# Patient Record
Sex: Female | Born: 1985
Health system: Southern US, Community
[De-identification: ages and names within clinical notes are randomized; demographics above are authoritative.]

## PROBLEM LIST (undated history)

## (undated) DIAGNOSIS — F429 Obsessive-compulsive disorder, unspecified: Secondary | ICD-10-CM

## (undated) DIAGNOSIS — F419 Anxiety disorder, unspecified: Secondary | ICD-10-CM

## (undated) HISTORY — DX: Obsessive-compulsive disorder, unspecified: F42.9

## (undated) HISTORY — DX: Anxiety disorder, unspecified: F41.9

---
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
## 2019-06-19 NOTE — L&D Delivery Note (Signed)
Delivery Note At 11:13 PM a viable female was delivered via Vaginal, Spontaneous (Presentation: DOA     ).  APGAR: per nurse, slow to cry but tone good , ; weight  .   Placenta status: spont, intact ,  .  Cord:3VC, tight nuchal, unable to reduce and clamped and cut on perineum.   with the following complications:  .  Cord pH: sent/ pending  Anesthesia:  epidural Episiotomy:  none Lacerations:  1st degree Suture Repair: 2.0 vicryl rapide 2 separate figure of 8s Est. Blood Loss (mL):  450, aggressive bleeding after placenta for a few minutes, treated with 1000 mcg rectal cytotec, pitocin and fundal massage. Bleeding stopped abruptly, IV TXA ordered but never started.  Mom to postpartum.  Baby to Couplet care / Skin to Skin.  Lendon Colonel 02/04/2020, 11:28 PM

## 2019-06-25 LAB — OB RESULTS CONSOLE ABO/RH: RH Type: POSITIVE

## 2019-06-25 LAB — OB RESULTS CONSOLE RPR: RPR: NONREACTIVE

## 2019-06-25 LAB — OB RESULTS CONSOLE RUBELLA ANTIBODY, IGM: Rubella: IMMUNE

## 2019-06-25 LAB — OB RESULTS CONSOLE GC/CHLAMYDIA
Chlamydia: NEGATIVE
Gonorrhea: NEGATIVE

## 2019-06-25 LAB — OB RESULTS CONSOLE ANTIBODY SCREEN: Antibody Screen: NEGATIVE

## 2019-06-25 LAB — OB RESULTS CONSOLE HIV ANTIBODY (ROUTINE TESTING): HIV: NONREACTIVE

## 2019-06-25 LAB — OB RESULTS CONSOLE HEPATITIS B SURFACE ANTIGEN: Hepatitis B Surface Ag: NEGATIVE

## 2019-09-11 ENCOUNTER — Other Ambulatory Visit: Payer: Self-pay

## 2019-09-11 ENCOUNTER — Ambulatory Visit: Payer: BC Managed Care – PPO | Admitting: Cardiovascular Disease

## 2019-09-11 ENCOUNTER — Encounter: Payer: Self-pay | Admitting: Cardiovascular Disease

## 2019-09-11 ENCOUNTER — Telehealth: Payer: Self-pay | Admitting: *Deleted

## 2019-09-11 VITALS — BP 146/92 | HR 67 | Ht 65.25 in | Wt 118.4 lb

## 2019-09-11 DIAGNOSIS — I10 Essential (primary) hypertension: Secondary | ICD-10-CM | POA: Diagnosis not present

## 2019-09-11 DIAGNOSIS — Z3A16 16 weeks gestation of pregnancy: Secondary | ICD-10-CM

## 2019-09-11 DIAGNOSIS — Z3490 Encounter for supervision of normal pregnancy, unspecified, unspecified trimester: Secondary | ICD-10-CM

## 2019-09-11 HISTORY — DX: Essential (primary) hypertension: I10

## 2019-09-11 HISTORY — DX: Encounter for supervision of normal pregnancy, unspecified, unspecified trimester: Z34.90

## 2019-09-11 NOTE — Addendum Note (Signed)
Addended by: Burnell Blanks on: 09/11/2019 06:29 PM   Modules accepted: Orders

## 2019-09-11 NOTE — Telephone Encounter (Signed)
Spoke with patient regarding sleep study, she does not want to have at this time Dr [facility] Salvia aware

## 2019-09-11 NOTE — Patient Instructions (Addendum)
Medication Instructions:  Your physician recommends that you continue on your current medications as directed. Please refer to the Current Medication list given to you today.   Labwork: NONE   Testing/Procedures: Your physician has requested that you have a renal artery duplex. During this test, an ultrasound is used to evaluate blood flow to the kidneys. Allow one hour for this exam. Do not eat after midnight the day before and avoid carbonated beverages. Take your medications as you usually do.  CALL IF YOU CHANGE YOUR MIND ON THE SLEEP STUDY DR Ophir RECOMMENDED    Follow-Up: Your physician recommends that you schedule a follow-up appointment in: 1 MONTH WITH PHARM D    Your physician recommends that you schedule a follow-up appointment in: 4 MONTHS WITH DR [facility] THE OFFICE WILL CALL YOU WITH APPOINTMENT   MONITOR AND LOG YOUR BLOOD PRESSURE TWICE A DAY. BRING READINGS TO YOUR FOLLOW UP   DASH Eating Plan DASH stands for "Dietary Approaches to Stop Hypertension." The DASH eating plan is a healthy eating plan that has been shown to reduce high blood pressure (hypertension). It may also reduce your risk for type 2 diabetes, heart disease, and stroke. The DASH eating plan may also help with weight loss. What are tips for following this plan?  General guidelines  Avoid eating more than 2,300 mg (milligrams) of salt (sodium) a day. If you have hypertension, you may need to reduce your sodium intake to 1,500 mg a day.  Limit alcohol intake to no more than 1 drink a day for nonpregnant women and 2 drinks a day for men. One drink equals 12 oz of beer, 5 oz of wine, or 1 oz of hard liquor.  Work with your health care provider to maintain a healthy body weight or to lose weight. Ask what an ideal weight is for you.  Get at least 30 minutes of exercise that causes your heart to beat faster (aerobic exercise) most days of the week. Activities may include walking, swimming, or biking.   Work with your health care provider or diet and nutrition specialist (dietitian) to adjust your eating plan to your individual calorie needs. Reading food labels   Check food labels for the amount of sodium per serving. Choose foods with less than 5 percent of the Daily Value of sodium. Generally, foods with less than 300 mg of sodium per serving fit into this eating plan.  To find whole grains, look for the word "whole" as the first word in the ingredient list. Shopping  Buy products labeled as "low-sodium" or "no salt added."  Buy fresh foods. Avoid canned foods and premade or frozen meals. Cooking  Avoid adding salt when cooking. Use salt-free seasonings or herbs instead of table salt or sea salt. Check with your health care provider or pharmacist before using salt substitutes.  Do not fry foods. Cook foods using healthy methods such as baking, boiling, grilling, and broiling instead.  Cook with heart-healthy oils, such as olive, canola, soybean, or sunflower oil. Meal planning  Eat a balanced diet that includes: ? 5 or more servings of fruits and vegetables each day. At each meal, try to fill half of your plate with fruits and vegetables. ? Up to 6-8 servings of whole grains each day. ? Less than 6 oz of lean meat, poultry, or fish each day. A 3-oz serving of meat is about the same size as a deck of cards. One egg equals 1 oz. ? 2 servings of low-fat  dairy each day. ? A serving of nuts, seeds, or beans 5 times each week. ? Heart-healthy fats. Healthy fats called Omega-3 fatty acids are found in foods such as flaxseeds and coldwater fish, like sardines, salmon, and mackerel.  Limit how much you eat of the following: ? Canned or prepackaged foods. ? Food that is high in trans fat, such as fried foods. ? Food that is high in saturated fat, such as fatty meat. ? Sweets, desserts, sugary drinks, and other foods with added sugar. ? Full-fat dairy products.  Do not salt foods before  eating.  Try to eat at least 2 vegetarian meals each week.  Eat more home-cooked food and less restaurant, buffet, and fast food.  When eating at a restaurant, ask that your food be prepared with less salt or no salt, if possible. What foods are recommended? The items listed may not be a complete list. Talk with your dietitian about what dietary choices are best for you. Grains Whole-grain or whole-wheat bread. Whole-grain or whole-wheat pasta. Brown rice. Modena Morrow. Bulgur. Whole-grain and low-sodium cereals. Pita bread. Low-fat, low-sodium crackers. Whole-wheat flour tortillas. Vegetables Fresh or frozen vegetables (raw, steamed, roasted, or grilled). Low-sodium or reduced-sodium tomato and vegetable juice. Low-sodium or reduced-sodium tomato sauce and tomato paste. Low-sodium or reduced-sodium canned vegetables. Fruits All fresh, dried, or frozen fruit. Canned fruit in natural juice (without added sugar). Meat and other protein foods Skinless chicken or [place]. Ground chicken or [place]. Pork with fat trimmed off. Fish and seafood. Egg whites. Dried beans, peas, or lentils. Unsalted nuts, nut butters, and seeds. Unsalted canned beans. Lean cuts of beef with fat trimmed off. Low-sodium, lean deli meat. Dairy Low-fat (1%) or fat-free (skim) milk. Fat-free, low-fat, or reduced-fat cheeses. Nonfat, low-sodium ricotta or cottage cheese. Low-fat or nonfat yogurt. Low-fat, low-sodium cheese. Fats and oils Soft margarine without trans fats. Vegetable oil. Low-fat, reduced-fat, or light mayonnaise and salad dressings (reduced-sodium). Canola, safflower, olive, soybean, and sunflower oils. Avocado. Seasoning and other foods Herbs. Spices. Seasoning mixes without salt. Unsalted popcorn and pretzels. Fat-free sweets. What foods are not recommended? The items listed may not be a complete list. Talk with your dietitian about what dietary choices are best for you. Grains Baked goods made with fat,  such as croissants, muffins, or some breads. Dry pasta or rice meal packs. Vegetables Creamed or fried vegetables. Vegetables in a cheese sauce. Regular canned vegetables (not low-sodium or reduced-sodium). Regular canned tomato sauce and paste (not low-sodium or reduced-sodium). Regular tomato and vegetable juice (not low-sodium or reduced-sodium). Angie Fava. Olives. Fruits Canned fruit in a light or heavy syrup. Fried fruit. Fruit in cream or butter sauce. Meat and other protein foods Fatty cuts of meat. Ribs. Fried meat. Berniece Salines. Sausage. Bologna and other processed lunch meats. Salami. Fatback. Hotdogs. Bratwurst. Salted nuts and seeds. Canned beans with added salt. Canned or smoked fish. Whole eggs or egg yolks. Chicken or [place] with skin. Dairy Whole or 2% milk, cream, and half-and-half. Whole or full-fat cream cheese. Whole-fat or sweetened yogurt. Full-fat cheese. Nondairy creamers. Whipped toppings. Processed cheese and cheese spreads. Fats and oils Butter. Stick margarine. Lard. Shortening. Ghee. Bacon fat. Tropical oils, such as coconut, palm kernel, or palm oil. Seasoning and other foods Salted popcorn and pretzels. Onion salt, garlic salt, seasoned salt, table salt, and sea salt. Worcestershire sauce. Tartar sauce. Barbecue sauce. Teriyaki sauce. Soy sauce, including reduced-sodium. Steak sauce. Canned and packaged gravies. Fish sauce. Oyster sauce. Cocktail sauce. Horseradish that you find  on the shelf. Ketchup. Mustard. Meat flavorings and tenderizers. Bouillon cubes. Hot sauce and Tabasco sauce. Premade or packaged marinades. Premade or packaged taco seasonings. Relishes. Regular salad dressings. Where to find more information:  National Heart, Lung, and Blood Institute: PopSteam.is  American Heart Association: www.heart.org Summary  The DASH eating plan is a healthy eating plan that has been shown to reduce high blood pressure (hypertension). It may also reduce your risk for  type 2 diabetes, heart disease, and stroke.  With the DASH eating plan, you should limit salt (sodium) intake to 2,300 mg a day. If you have hypertension, you may need to reduce your sodium intake to 1,500 mg a day.  When on the DASH eating plan, aim to eat more fresh fruits and vegetables, whole grains, lean proteins, low-fat dairy, and heart-healthy fats.  Work with your health care provider or diet and nutrition specialist (dietitian) to adjust your eating plan to your individual calorie needs. This information is not intended to replace advice given to you by your health care provider. Make sure you discuss any questions you have with your health care provider. Document Released: 05/24/2011 Document Revised: 05/17/2017 Document Reviewed: 05/28/2016 Elsevier Patient Education  2020 [employer].

## 2019-09-11 NOTE — Progress Notes (Signed)
Hypertension Clinic Initial Assessment:    Date:  09/11/2019   ID:  Tammie Singh, DOB [birth year], MRN [id number]  PCP:  Patient, No Pcp Per  Cardiologist:  No primary care provider on file.  Nephrologist:  Referring MD: No ref. provider found   CC: Hypertension  History of Present Illness:    Tammie Singh is a 34 y.o. female at 31 weeks of pregnancy with a hx of hypertension here to establish care in the hypertension clinic.  She reports that her BP has been high since she was in college.  No etiology was identified.  She is currently [redacted] weeks pregnant and her BP has been as high as the 190s over 110s when she goes to her OB.  She notes that it is not that high when she checks it at home.  It typically runs in the 140s systolic.  She was started on labetalol.  Since that time her blood pressure has been around 110s to 120s over 60s to 70s.  She has felt well.  She notes that she has significant whitecoat hypertension.  When she goes to the office it is high and then when the staff alerts her about the value it seems to get higher.  She exercises regularly and has no exertional chest pain or shortness of breath.  She denies any lower extremity edema, orthopnea, or PND.  She saw Dr. Ernestina Penna and had labs that were negative for urine protein.  Blood counts were within normal limits.  Kidney function, liver function, and electrolytes were all.  She was referred to cardiology for further evaluation.  She notes that her diet is very good.  She eats lots of fruits and vegetables and does not have much salt in her diet.  She doesn't drink EtOH now but none.  She does not smoke.  Her husband notes that she snores and sometimes has apneic episodes.  Previous antihypertensives: Lisinopril 5mg  in college   Past Medical History:  Diagnosis Date  . Anxiety   . Essential hypertension 09/11/2019  . Nephritis 2005  . OCD (obsessive compulsive disorder)   . Pregnancy 09/11/2019    Past  Surgical History:  Procedure Laterality Date  . WISDOM TOOTH EXTRACTION  2005    Current Medications: Current Meds  Medication Sig  . aspirin EC 81 MG tablet Take 81 mg by mouth daily.  . clonazePAM (KLONOPIN) 0.5 MG tablet Take 0.5 mg by mouth 2 (two) times daily as needed.  . Folic Acid 0.8 MG CAPS Take by mouth.  . labetalol (NORMODYNE) 200 MG tablet Take 200 mg by mouth every 8 (eight) hours.  . TRINTELLIX 10 MG TABS tablet Take 10 mg by mouth at bedtime.  . vortioxetine HBr (TRINTELLIX) 20 MG TABS tablet Take 20 mg by mouth daily.     Allergies:   Patient has no known allergies.   Social History   Socioeconomic History  . Marital status: Married    Spouse name: Not on file  . Number of children: Not on file  . Years of education: Not on file  . Highest education level: Not on file  Occupational History  . Not on file  Tobacco Use  . Smoking status: Never Smoker  . Smokeless tobacco: Never Used  Substance and Sexual Activity  . Alcohol use: Not Currently  . Drug use: Never  . Sexual activity: Not on file  Other Topics Concern  . Not on file  Social History Narrative  . Not on file  Social Determinants of Health   Financial Resource Strain:   . Difficulty of Paying Living Expenses:   Food Insecurity:   . Worried About Charity fundraiser in the Last Year:   . Arboriculturist in the Last Year:   Transportation Needs:   . Film/video editor (Medical):   Marland Kitchen Lack of Transportation (Non-Medical):   Physical Activity:   . Days of Exercise per Week:   . Minutes of Exercise per Session:   Stress:   . Feeling of Stress :   Social Connections:   . Frequency of Communication with Friends and Family:   . Frequency of Social Gatherings with Friends and Family:   . Attends Religious Services:   . Active Member of Clubs or Organizations:   . Attends Archivist Meetings:   Marland Kitchen Marital Status:      Family History: The patient's family history includes  Asthma in her mother; Atrial fibrillation in her father; CAD in her father; Diabetes in her father; Heart disease in her father; High blood pressure in her maternal grandfather and maternal grandmother; Irritable bowel syndrome in her mother; Lung cancer in her maternal grandfather; Migraines in her sister.  ROS:   Please see the history of present illness.     All other systems reviewed and are negative.  EKGs/Labs/Other Studies Reviewed:    EKG:  EKG is not ordered today.     Recent Labs: No results found for requested labs within last 8760 hours.   Recent Lipid Panel No results found for: CHOL, TRIG, HDL, CHOLHDL, VLDL, LDLCALC, LDLDIRECT  Physical Exam:    VS:  BP (!) 146/92   Pulse 67   Ht 5' 5.25" (1.657 m)   Wt 118 lb 6.4 oz (53.7 kg)   SpO2 95%   BMI 19.55 kg/m     Wt Readings from Last 3 Encounters:  09/11/19 118 lb 6.4 oz (53.7 kg)    VS:  BP (!) 146/92   Pulse 67   Ht 5' 5.25" (1.657 m)   Wt 118 lb 6.4 oz (53.7 kg)   SpO2 95%   BMI 19.55 kg/m  , BMI Body mass index is 19.55 kg/m. GENERAL:  Well appearing.  No acute distress HEENT: Pupils equal round and reactive, fundi not visualized, oral mucosa unremarkable NECK:  No jugular venous distention, waveform within normal limits, carotid upstroke brisk and symmetric, no bruits LUNGS:  Clear to auscultation bilaterally HEART:  RRR.  PMI not displaced or sustained,S1 and S2 within normal limits, no S3, no S4, no clicks, no rubs, no murmurs ABD:  Flat, positive bowel sounds normal in frequency in pitch, no bruits, no rebound, no guarding, no midline pulsatile mass, no hepatomegaly, no splenomegaly EXT:  2 plus pulses throughout, no edema, no cyanosis no clubbing SKIN:  No rashes no nodules NEURO:  Cranial nerves II through XII grossly intact, motor grossly intact throughout PSYCH:  Cognitively intact, oriented to person place and time  ASSESSMENT:    1. Hypertension, unspecified type   2. Essential hypertension    3. [redacted] weeks gestation of pregnancy     PLAN:    # Essential hypertension:  Ms. Salmons has hypertension dating back to her 67s.  It is unclear why her blood pressure is elevated given that she is otherwise healthy and has a normal body habitus.  She has not had an extensive family history of early onset hypertension.  While she is pregnant her goal blood pressure  is less than 160/110.  However in general her blood pressure should be less than 140/90.  It is quite clear that anxiety and stress contribute to her blood pressure.  She has significant whitecoat hypertension on top of her essential hypertension.  We did discuss following a healthy diet and limiting sodium intake to 2000 mg daily.  She should also continue exercising regularly as directed by her OB/GYN.  We will evaluate for secondary causes of hypertension including renal artery Dopplers and a sleep study given her history of snoring and apnea.  She is not interested in enrolling in the PREP program through the [facility].  We will enroll her in a remote patient monitoring program.  This will also help [place] to better determine what her blood pressure is doing in her home environment.   Disposition:    FU with MD/PharmD in 1 month    Medication Adjustments/Labs and Tests Ordered: Current medicines are reviewed at length with the patient today.  Concerns regarding medicines are outlined above.  No orders of the defined types were placed in this encounter.  No orders of the defined types were placed in this encounter.    Signed, Chilton Si, MD  09/11/2019 5:18 PM    [facility]

## 2019-09-14 ENCOUNTER — Telehealth: Payer: Self-pay | Admitting: Cardiovascular Disease

## 2019-09-14 NOTE — Telephone Encounter (Signed)
Follow up:    I was in the patient chart, she stated some one called her. As I was looking the said she will call back after speaking with her other doctor first.

## 2019-09-14 NOTE — Telephone Encounter (Signed)
Left message for patient to call and schedule renal artery duplex ordered by Dr. Hassell Halim

## 2019-09-15 ENCOUNTER — Telehealth: Payer: Self-pay | Admitting: Cardiovascular Disease

## 2019-09-15 ENCOUNTER — Telehealth (HOSPITAL_COMMUNITY): Payer: Self-pay | Admitting: Cardiovascular Disease

## 2019-09-15 NOTE — Telephone Encounter (Signed)
Spoke with patient regarding renal artery duplex ordered by Dr. Vela Prose states she is gong to another provider's office and does not wish to schedule

## 2019-09-15 NOTE — Telephone Encounter (Signed)
Called patient to set up Renal doppler order by Dr. [facility]via. Patient states she is seeing another provider @ [facility].  Order for Renal removed from workque.

## 2019-10-15 ENCOUNTER — Ambulatory Visit: Payer: BC Managed Care – PPO

## 2019-10-20 ENCOUNTER — Telehealth: Payer: BC Managed Care – PPO

## 2020-01-12 ENCOUNTER — Encounter (HOSPITAL_COMMUNITY): Payer: Self-pay | Admitting: Obstetrics & Gynecology

## 2020-01-12 ENCOUNTER — Inpatient Hospital Stay (HOSPITAL_COMMUNITY)
Admission: AD | Admit: 2020-01-12 | Discharge: 2020-01-14 | DRG: 833 | Disposition: A | Discharge status: Home / Self Care | Payer: BC Managed Care – PPO | Attending: Obstetrics & Gynecology | Admitting: Obstetrics & Gynecology

## 2020-01-12 ENCOUNTER — Other Ambulatory Visit: Payer: Self-pay

## 2020-01-12 DIAGNOSIS — Z3A34 34 weeks gestation of pregnancy: Secondary | ICD-10-CM | POA: Diagnosis not present

## 2020-01-12 DIAGNOSIS — Z363 Encounter for antenatal screening for malformations: Secondary | ICD-10-CM | POA: Diagnosis not present

## 2020-01-12 DIAGNOSIS — Z3A33 33 weeks gestation of pregnancy: Secondary | ICD-10-CM | POA: Diagnosis not present

## 2020-01-12 DIAGNOSIS — O365930 Maternal care for other known or suspected poor fetal growth, third trimester, not applicable or unspecified: Secondary | ICD-10-CM | POA: Diagnosis present

## 2020-01-12 DIAGNOSIS — F419 Anxiety disorder, unspecified: Secondary | ICD-10-CM | POA: Diagnosis present

## 2020-01-12 DIAGNOSIS — O99343 Other mental disorders complicating pregnancy, third trimester: Secondary | ICD-10-CM | POA: Diagnosis present

## 2020-01-12 DIAGNOSIS — O10913 Unspecified pre-existing hypertension complicating pregnancy, third trimester: Secondary | ICD-10-CM | POA: Diagnosis not present

## 2020-01-12 DIAGNOSIS — O133 Gestational [pregnancy-induced] hypertension without significant proteinuria, third trimester: Secondary | ICD-10-CM | POA: Diagnosis not present

## 2020-01-12 DIAGNOSIS — O10013 Pre-existing essential hypertension complicating pregnancy, third trimester: Secondary | ICD-10-CM | POA: Diagnosis not present

## 2020-01-12 DIAGNOSIS — O99340 Other mental disorders complicating pregnancy, unspecified trimester: Secondary | ICD-10-CM | POA: Diagnosis not present

## 2020-01-12 DIAGNOSIS — O163 Unspecified maternal hypertension, third trimester: Secondary | ICD-10-CM | POA: Diagnosis present

## 2020-01-12 DIAGNOSIS — O169 Unspecified maternal hypertension, unspecified trimester: Secondary | ICD-10-CM

## 2020-01-12 LAB — CBC
HCT: 34.1 % — ABNORMAL LOW (ref 36.0–46.0)
Hemoglobin: 11.2 g/dL — ABNORMAL LOW (ref 12.0–15.0)
MCH: 31.9 pg (ref 26.0–34.0)
MCHC: 32.8 g/dL (ref 30.0–36.0)
MCV: 97.2 fL (ref 80.0–100.0)
Platelets: 209 10*3/uL (ref 150–400)
RBC: 3.51 MIL/uL — ABNORMAL LOW (ref 3.87–5.11)
RDW: 12.9 % (ref 11.5–15.5)
WBC: 11.6 10*3/uL — ABNORMAL HIGH (ref 4.0–10.5)
nRBC: 0 % (ref 0.0–0.2)

## 2020-01-12 LAB — PROTEIN / CREATININE RATIO, URINE
Creatinine, Urine: 19.75 mg/dL
Total Protein, Urine: <6 mg/dL

## 2020-01-12 LAB — COMPREHENSIVE METABOLIC PANEL
ALT: 43 U/L (ref 0–44)
AST: 49 U/L — ABNORMAL HIGH (ref 15–41)
Albumin: 3 g/dL — ABNORMAL LOW (ref 3.5–5.0)
Alkaline Phosphatase: 86 U/L (ref 38–126)
Anion gap: 9 (ref 5–15)
BUN: 10 mg/dL (ref 6–20)
CO2: 17 mmol/L — ABNORMAL LOW (ref 22–32)
Calcium: 8.6 mg/dL — ABNORMAL LOW (ref 8.9–10.3)
Chloride: 107 mmol/L (ref 98–111)
Creatinine, Ser: 0.74 mg/dL (ref 0.44–1.00)
GFR calc Af Amer: >60 mL/min (ref >60)
GFR calc non Af Amer: >60 mL/min (ref >60)
Glucose, Bld: 86 mg/dL (ref 70–99)
Potassium: 3.9 mmol/L (ref 3.5–5.1)
Sodium: 133 mmol/L — ABNORMAL LOW (ref 135–145)
Total Bilirubin: 0.9 mg/dL (ref 0.3–1.2)
Total Protein: 6.2 g/dL — ABNORMAL LOW (ref 6.5–8.1)

## 2020-01-12 MED ORDER — HYDRALAZINE HCL 20 MG/ML IJ SOLN: 10.0000 mg | INTRAMUSCULAR | Status: DC | PRN

## 2020-01-12 MED ORDER — VORTIOXETINE HBR 20 MG PO TABS
30.0000 mg | ORAL_TABLET | Freq: Every day | ORAL | Status: DC
  Administered 2020-01-12 – 2020-01-13 (×2): 30 mg via ORAL
  Filled 2020-01-12 (×2): qty 1.5

## 2020-01-12 MED ORDER — ALPRAZOLAM 0.25 MG PO TABS
1.0000 mg | ORAL_TABLET | Freq: Once | ORAL | Status: AC
  Administered 2020-01-12: 1 mg via ORAL
  Filled 2020-01-12: qty 4

## 2020-01-12 MED ORDER — LABETALOL HCL 5 MG/ML IV SOLN
20.0000 mg | INTRAVENOUS | Status: DC | PRN
  Filled 2020-01-12 (×2): qty 4

## 2020-01-12 MED ORDER — DOCUSATE SODIUM 100 MG PO CAPS
100.0000 mg | ORAL_CAPSULE | Freq: Every day | ORAL | Status: DC
  Filled 2020-01-12: qty 1

## 2020-01-12 MED ORDER — ACETAMINOPHEN 325 MG PO TABS: 650.0000 mg | ORAL_TABLET | ORAL | Status: DC | PRN

## 2020-01-12 MED ORDER — LABETALOL HCL 5 MG/ML IV SOLN
80.0000 mg | INTRAVENOUS | Status: DC | PRN
  Administered 2020-01-12: 80 mg via INTRAVENOUS
  Filled 2020-01-12: qty 16

## 2020-01-12 MED ORDER — BETAMETHASONE SOD PHOS & ACET 6 (3-3) MG/ML IJ SUSP
12.0000 mg | INTRAMUSCULAR | Status: AC
  Administered 2020-01-13: 12 mg via INTRAMUSCULAR

## 2020-01-12 MED ORDER — LABETALOL HCL 5 MG/ML IV SOLN
40.0000 mg | INTRAVENOUS | Status: DC | PRN
  Administered 2020-01-12: 40 mg via INTRAVENOUS
  Filled 2020-01-12: qty 8

## 2020-01-12 MED ORDER — LABETALOL HCL 200 MG PO TABS
300.0000 mg | ORAL_TABLET | Freq: Three times a day (TID) | ORAL | Status: DC
  Administered 2020-01-12 – 2020-01-14 (×5): 300 mg via ORAL
  Filled 2020-01-12 (×6): qty 1

## 2020-01-12 MED ORDER — NIFEDIPINE ER OSMOTIC RELEASE 30 MG PO TB24
30.0000 mg | ORAL_TABLET | Freq: Once | ORAL | Status: AC
  Administered 2020-01-12: 30 mg via ORAL
  Filled 2020-01-12: qty 1

## 2020-01-12 MED ORDER — BETAMETHASONE SOD PHOS & ACET 6 (3-3) MG/ML IJ SUSP
12.0000 mg | Freq: Once | INTRAMUSCULAR | Status: AC
  Administered 2020-01-12: 12 mg via INTRAMUSCULAR
  Filled 2020-01-12: qty 5

## 2020-01-12 MED ORDER — CALCIUM CARBONATE ANTACID 500 MG PO CHEW: 2.0000 | CHEWABLE_TABLET | ORAL | Status: DC | PRN

## 2020-01-12 MED ORDER — PRENATAL MULTIVITAMIN CH
1.0000 | ORAL_TABLET | Freq: Every day | ORAL | Status: DC
  Administered 2020-01-13: 1 via ORAL
  Filled 2020-01-12: qty 1

## 2020-01-12 NOTE — MAU Note (Signed)
Sent over from rtn appt, for BP monitoring and fetal monitoring.  BP up, is on medication. Saw cardiologist last wk, everything was fine. Denies HA, visual changes, epigastric pain, reports some increase in swelling in her feet (minimal noted). Denies bleeding or LOF. Reports +FM

## 2020-01-12 NOTE — H&P (Addendum)
Tammie Singh is a 34 y.o. female G1, at 33.6 wks by 1st trim sono. Presenting for evaluation of high BPs in office. Seen for routine Ob visit, denies any HA/ blurred vision/ SOB. Feels good FMs. Her Ob Dr Ernestina Penna thought she was more swollen but note 2 lbs wt gain in 2 weeks.  Pt saw her Cardiologist last wk, BP was 130s/ 80s  Known CHTN/ white coat syndrome per pt since in high school but stopped all meds in college. Now on Labetalol 200mg  tid and anti-anxiety meds- Trintellix 10mg  tid, Klonopin prn and Clonazepam 0.5 mg prn for severe anxiety, seen specialists. AGA growth on 7/1 at 30 wks at 3'6" at 37%, Vx. AFI nl.   MAU BPs were in severe range needing 20 mg, 20 mg and now 40 mg Labetalol meting admission criteria for at least observation and titration of BP meds. AST/ ALT in 40s. Creatinine 0.7, urine p/c pending (Epic was down, so no recent updates on labs Baseline AST/ALT in Feb'21- wnl at 17, 20  Patient got more anxious, tearful about admission, declining it, contacted Dr her primary Ob who agrees and spoke with patient on phone, now patient agrees to stay.    OB History    Gravida  1   Para      Term      Preterm      AB      Living        SAB      TAB      Ectopic      Multiple      Live Births             Past Medical History:  Diagnosis Date  . Anxiety   . Essential hypertension 09/11/2019  . Nephritis 2005  . OCD (obsessive compulsive disorder)   . Pregnancy 09/11/2019   Past Surgical History:  Procedure Laterality Date  . WISDOM TOOTH EXTRACTION  2005   Family History: family history includes Asthma in her mother; Atrial fibrillation in her father; CAD in her father; Diabetes in her father; Heart disease in her father; High blood pressure in her maternal grandfather and maternal grandmother; Irritable bowel syndrome in her mother; Lung cancer in her maternal grandfather; Migraines in her sister. Social History:  reports that she has  never smoked. She has never used smokeless tobacco. She reports previous alcohol use. She reports that she does not use drugs.     Maternal Diabetes: No Genetic Screening: Normal Panorama NIPS, AFP1 nl Maternal Ultrasounds/Referrals: Normal Fetal Ultrasounds or other Referrals:  None Maternal Substance Abuse:  No Significant Maternal Medications:  Meds include: Other:  Labetalol TID, Trintellix 10mg  TID and Clonazepam pen  Significant Maternal Lab Results:  None Other Comments:  severe anxiety  Review of Systems History   Blood pressure (!) 158/97, pulse 65, temperature 99.2 F (37.3 C), temperature source Oral, resp. rate 17, height 5' 5.25" (1.657 m), weight 65.4 kg, SpO2 99 %. Exam Physical Exam  Patient Vitals for the past 24 hrs:  BP Temp Temp src Pulse Resp SpO2 Height Weight  01/12/20 2000 (!) 151/92 -- -- 71 -- -- -- --  01/12/20 1945 (!) 164/99 -- -- 67 -- -- -- --  01/12/20 1930 (!) 170/98 -- -- 85 -- -- -- --  01/12/20 1915 (!) 158/97 -- -- 65 -- -- -- --  01/12/20 1900 (!) 182/111 -- -- 76 -- -- -- --  01/12/20 1845 (!) 169/108 -- -- 67 -- -- -- --  01/12/20 1830 (!) 152/101 -- -- 64 -- -- -- --  01/12/20 1820 (!) 156/101 -- -- 71 -- -- -- --  01/12/20 1815 (!) 162/90 -- -- 62 -- -- -- --  01/12/20 1800 (!) 163/96 -- -- 64 -- -- -- --  01/12/20 1745 (!) 162/96 -- -- 64 -- -- -- --  01/12/20 1730 (!) 164/95 -- -- 68 -- -- -- --  01/12/20 1715 (!) 149/92 -- -- 64 -- -- -- --  01/12/20 1700 (!) 157/95 -- -- 71 -- -- -- --  01/12/20 1650 -- -- -- -- -- 99 % -- --  01/12/20 1645 (!) 151/93 -- -- 75 -- 99 % -- --  01/12/20 1640 -- -- -- -- -- 99 % -- --  01/12/20 1637 (!) 147/99 -- -- 68 -- -- -- --  01/12/20 1609 (!) 164/108 99.2 F (37.3 C) Oral 74 17 100 % 5' 5.25" (1.657 m) 65.4 kg   Physical exam:   A&O x 3, no acute distress.but very anxious HEENT neg, no thyromegaly Lungs CTA bilat CV RRR, S1S2 normal Abdo soft, non tender, non acute Extr +2 pitting  edema. DTR +3/ +3. No clonus  Pelvic bit FHT  140s + accels no decels mod variability- cat I reactive  Toco none   Prenatal labs: ABO, Rh:  O(+) Antibody:  Neg Rubella:  Imm RPR:  NR HBsAg:   Neg HIV:   Neg GBS:   not done yet  NIPS nl  AFP1 nl  Glucola nl   Assessment/Plan: 34 yo, G1, 33.6 wks with CHTN now severe range BPs needing IV antiHTN meds. Asymptomatic woman. Elevated AST/ ALT are concerning for developing PEC. Severe anxiety causing BPs to get worse.  Admit for observation 1) CHTN- Change antiHTN dose to Labetaol 300mg  TID, added Procardia 30mg  XL, goal <150/90. Repeat PIH labs in AM, 24hr urine protein collection. Urine P/C pending      Last growth sono 7/1, will repeat tomorrow and MFM consult      Deferring magnesium for now since not planning delivery and no neural symptoms      CEFM for now. Change to FHT 1 hr TID once BP improved 2) Prematurity with possible need for preterm delivery- BTMZ given at 5 pm repeat at 24 hrs.  3) Severe Anxiety- gave Xanax 1 mg now will see if daily Clonazepam needed. Also continue Trintellix 10mg  TID  01/12/2020, 7:22 PM

## 2020-01-12 NOTE — Progress Notes (Signed)
MD notified about pts repeated doses of IV Labetalol in MAU. RN instructed to also give scheduled dose of Labetalol, resulting from the BP of 151/88 at 2130.

## 2020-01-13 ENCOUNTER — Inpatient Hospital Stay (HOSPITAL_BASED_OUTPATIENT_CLINIC_OR_DEPARTMENT_OTHER): Payer: BC Managed Care – PPO

## 2020-01-13 DIAGNOSIS — O133 Gestational [pregnancy-induced] hypertension without significant proteinuria, third trimester: Secondary | ICD-10-CM | POA: Diagnosis not present

## 2020-01-13 DIAGNOSIS — O99340 Other mental disorders complicating pregnancy, unspecified trimester: Secondary | ICD-10-CM

## 2020-01-13 DIAGNOSIS — F419 Anxiety disorder, unspecified: Secondary | ICD-10-CM

## 2020-01-13 DIAGNOSIS — Z363 Encounter for antenatal screening for malformations: Secondary | ICD-10-CM | POA: Diagnosis not present

## 2020-01-13 DIAGNOSIS — Z3A34 34 weeks gestation of pregnancy: Secondary | ICD-10-CM

## 2020-01-13 DIAGNOSIS — O365930 Maternal care for other known or suspected poor fetal growth, third trimester, not applicable or unspecified: Secondary | ICD-10-CM

## 2020-01-13 DIAGNOSIS — O10913 Unspecified pre-existing hypertension complicating pregnancy, third trimester: Secondary | ICD-10-CM

## 2020-01-13 LAB — COMPREHENSIVE METABOLIC PANEL
ALT: 38 U/L (ref 0–44)
AST: 38 U/L (ref 15–41)
Albumin: 2.7 g/dL — ABNORMAL LOW (ref 3.5–5.0)
Alkaline Phosphatase: 90 U/L (ref 38–126)
Anion gap: 8 (ref 5–15)
BUN: 10 mg/dL (ref 6–20)
CO2: 19 mmol/L — ABNORMAL LOW (ref 22–32)
Calcium: 8.8 mg/dL — ABNORMAL LOW (ref 8.9–10.3)
Chloride: 109 mmol/L (ref 98–111)
Creatinine, Ser: 0.75 mg/dL (ref 0.44–1.00)
GFR calc Af Amer: >60 mL/min (ref >60)
GFR calc non Af Amer: >60 mL/min (ref >60)
Glucose, Bld: 108 mg/dL — ABNORMAL HIGH (ref 70–99)
Potassium: 4 mmol/L (ref 3.5–5.1)
Sodium: 136 mmol/L (ref 135–145)
Total Bilirubin: 1.1 mg/dL (ref 0.3–1.2)
Total Protein: 5.8 g/dL — ABNORMAL LOW (ref 6.5–8.1)

## 2020-01-13 LAB — CBC
HCT: 33.4 % — ABNORMAL LOW (ref 36.0–46.0)
Hemoglobin: 10.7 g/dL — ABNORMAL LOW (ref 12.0–15.0)
MCH: 31.8 pg (ref 26.0–34.0)
MCHC: 32 g/dL (ref 30.0–36.0)
MCV: 99.1 fL (ref 80.0–100.0)
Platelets: 216 10*3/uL (ref 150–400)
RBC: 3.37 MIL/uL — ABNORMAL LOW (ref 3.87–5.11)
RDW: 13.2 % (ref 11.5–15.5)
WBC: 12.8 10*3/uL — ABNORMAL HIGH (ref 4.0–10.5)
nRBC: 0 % (ref 0.0–0.2)

## 2020-01-13 LAB — PROTEIN, URINE, 24 HOUR
Collection Interval-UPROT: 24 hours
Protein, Urine: <6 mg/dL
Urine Total Volume-UPROT: 3500 mL

## 2020-01-13 MED ORDER — CLONAZEPAM 0.25 MG PO TBDP
0.5000 mg | ORAL_TABLET | Freq: Once | ORAL | Status: AC
  Administered 2020-01-13: 0.5 mg via ORAL
  Filled 2020-01-13: qty 2

## 2020-01-13 NOTE — Progress Notes (Signed)
MD made aware of pt BP of 136/80 at 0434. RN instructed to reschedule 0600 dose of Labetalol to 0730.

## 2020-01-13 NOTE — Consult Note (Signed)
MFM Note  Tammie Singh is a 34 year old gravida 1 para 0 currently at 34 weeks and 0 days.  She was admitted last night after severe range blood pressures requiring multiple doses of IV labetalol for treatment was noted when she presented to the MAU.  She was sent to the MAU for evaluation after elevated blood pressures were noted during her routine office visit yesterday.  The patient has a history of chronic hypertension that had been treated with labetalol 200 mg 3 times a day.  She also has a history of severe anxiety that has been treated with 3 different anxiolytic medications.    Due to her severe range blood pressures, she was admitted to the hospital for observation. At the time of admission, her [facility] labs showed an elevated AST and ALT level in the low 40s.  Her P/C ratio was unable to be calculated due to low protein levels.  She currently has a 24-hour urine collection underway. She was given a complete course of antenatal corticosteroids.  Her labetalol dose was increased to 300 mg 3 times a day and she was also started on Procardia 30 mg XL daily.    Overnight her fetal status has been reassuring.  The patient's blood pressures overnight were in the 120s to 130s over 70s to 80s range.  She denies any signs or symptoms related to severe preeclampsia.    The patient had an ultrasound performed today showing an EFW of 4 pounds 3 ounces (5th percentile for her gestational age) indicating fetal growth restriction.  There was normal amniotic fluid noted with a total AFI of 18.4 cm.  A biophysical profile performed today was 8 out of 8. Doppler studies of the umbilical arteries performed today showed a normal S/D ratio of 2.74.  There were no signs of absent or reversed end-diastolic flow.  Other than chronic hypertension and severe anxiety, the patient denies any other significant past medical history.  She denies any significant past surgical history.  The patient was advised that her  anxiety may have contributed to her extremely elevated blood pressures last night.  We will await her 24-hour urine results to ensure that she does not have preeclampsia.  Should the patient's blood pressures remain under good control using labetalol and Procardia, she may be discharged home tomorrow with twice weekly fetal testing and blood pressure checks in your office.  Her liver function tests should be repeated tomorrow morning to ensure that they have not increased.  She should also have weekly PIH labs drawn as an outpatient.  Due to fetal growth restriction, she should also have weekly umbilical artery Doppler studies performed.    The patient was advised that due to chronic hypertension and fetal growth restriction, our goal for her pregnancy would be delivery at around 37 weeks.  An earlier delivery may be necessary should she develop signs and symptoms of severe preeclampsia or if her PIH labs are abnormal.  At the end of the consultation, the patient and her husband stated that all their questions had been answered to their complete satisfaction.    Thank you for referring this patient for Maternal-Fetal Medicine consultation.  Recommendations: -Repeat PIH labs tomorrow morning -Outpatient management may be considered should her blood pressures and PIH labs remain normal and she does not have  preeclampsia  -Continue labetalol and Procardia for treatment of her elevated blood pressures -Twice-weekly fetal testing  -Weekly PIH labs  -Weekly fluid checks with umbilical artery Doppler studies should be  performed due to fetal growth restriction -Delivery may be considered at around 37 weeks -An earlier delivery may be necessary should she show any signs or symptoms of severe preeclampsia or if her [facility] labs become abnormal  This note was copied from an original document that was generated using [employer].

## 2020-01-13 NOTE — Progress Notes (Signed)
Patient ID: Tammie Singh, female   DOB: 07-04-1985, 34 y.o.   MRN: [id number] HD#[id number]  Worsening CHTN with severe range BPs in 3rd trimester Severe anxiety   S: Slept for few hours after taking Xanax 1 mg. Reports feels well. Very cheerful and talking loudly. No HA/ vision changes.  O:  Patient Vitals for the past 24 hrs:  BP Temp Temp src Pulse Resp SpO2 Height Weight  01/13/20 1200 123/79 98.2 F (36.8 C) Oral 76 17 99 % -- --  01/13/20 0719 (!) 127/87 98 F (36.7 C) Oral 65 17 99 % -- --  01/13/20 0434 (!) 136/80 97.7 F (36.5 C) Axillary 68 18 99 % -- --  01/12/20 2325 (!) 125/61 98.3 F (36.8 C) Oral 79 18 97 % -- --  01/12/20 2130 (!) 151/88 -- -- 76 -- -- -- --  01/12/20 2000 (!) 151/92 -- -- 71 -- -- -- --  01/12/20 1945 (!) 164/99 -- -- 24 -- -- -- --  01/12/20 1930 (!) 170/98 -- -- 37 -- -- -- --  01/12/20 1915 (!) 158/97 -- -- 65 -- -- -- --  01/12/20 1900 (!) 182/111 -- -- 76 -- -- -- --  01/12/20 1845 (!) 169/108 -- -- 68 -- -- -- --  01/12/20 1830 (!) 152/101 -- -- 64 -- -- -- --  01/12/20 1820 (!) 156/101 -- -- 71 -- -- -- --  01/12/20 1815 (!) 162/90 -- -- 62 -- -- -- --  01/12/20 1800 (!) 163/96 -- -- 64 -- -- -- --  01/12/20 1745 (!) 162/96 -- -- 64 -- -- -- --  01/12/20 1730 (!) 164/95 -- -- 68 -- -- -- --  01/12/20 1715 (!) 149/92 -- -- 64 -- -- -- --  01/12/20 1700 (!) 157/95 -- -- 71 -- -- -- --  01/12/20 1650 -- -- -- -- -- 99 % -- --  01/12/20 1645 (!) 151/93 -- -- 75 -- 99 % -- --  01/12/20 1640 -- -- -- -- -- 99 % -- --  01/12/20 1637 (!) 147/99 -- -- 68 -- -- -- --  01/12/20 1609 (!) 164/108 99.2 F (37.3 C) Oral 74 17 100 % 5' 5.25" (1.657 m) 65.4 kg   Physical exam:  A&O x 3, no acute distress.  FHT  140s moderate variability, + accels no decels, reactive ---> ok to switch to intermittent monitoring q shift  Toco none   Exam not performed as pt got very upset as I was counseling on elevated LFTs and await 24hr urine for PEC diagnosis,  though LFTs improving. I counseled her on her BPs needing med dose change to Labetalol 300mg  TID and added Procardia 30mg  XL. I counseled on high risk nature of PEC to mom and baby and that she should take it easy at home if discharged. She suddenly changes to being very angry and agigated (from cheerful when we started to talk) stating  "you got me all upset with admission and then my BP went up and you pumped all these meds in me". I tried to reason with her but at this point a female family member at her bedside asked me to stop talking and leave.   AS far as her health, BP is better with new regimen. Anxiety is severe, RN called back later as pt requesting Clonazepam.  BMTZ #2 tonight at 5 pm 24hr urine collection will complete at 9.30 pm   Discharge planning per Dr since patient  seems to be trusting only her, quote "my doctor knows me the best, you dont know me like her"  --Shea Evans, MD FHT- reactive. Growth sono and MFM consult ordered. (last growth 4 wks back)

## 2020-01-14 LAB — COMPREHENSIVE METABOLIC PANEL
ALT: 35 U/L (ref 0–44)
AST: 38 U/L (ref 15–41)
Albumin: 2.8 g/dL — ABNORMAL LOW (ref 3.5–5.0)
Alkaline Phosphatase: 81 U/L (ref 38–126)
Anion gap: 11 (ref 5–15)
BUN: 10 mg/dL (ref 6–20)
CO2: 18 mmol/L — ABNORMAL LOW (ref 22–32)
Calcium: 8.4 mg/dL — ABNORMAL LOW (ref 8.9–10.3)
Chloride: 108 mmol/L (ref 98–111)
Creatinine, Ser: 0.92 mg/dL (ref 0.44–1.00)
GFR calc Af Amer: >60 mL/min (ref >60)
GFR calc non Af Amer: >60 mL/min (ref >60)
Glucose, Bld: 140 mg/dL — ABNORMAL HIGH (ref 70–99)
Potassium: 3.8 mmol/L (ref 3.5–5.1)
Sodium: 137 mmol/L (ref 135–145)
Total Bilirubin: 0.4 mg/dL (ref 0.3–1.2)
Total Protein: 5.8 g/dL — ABNORMAL LOW (ref 6.5–8.1)

## 2020-01-14 LAB — CBC WITH DIFFERENTIAL/PLATELET
Abs Immature Granulocytes: 0.14 10*3/uL — ABNORMAL HIGH (ref 0.00–0.07)
Basophils Absolute: 0 10*3/uL (ref 0.0–0.1)
Basophils Relative: 0 %
Eosinophils Absolute: 0 10*3/uL (ref 0.0–0.5)
Eosinophils Relative: 0 %
HCT: 32.7 % — ABNORMAL LOW (ref 36.0–46.0)
Hemoglobin: 10.4 g/dL — ABNORMAL LOW (ref 12.0–15.0)
Immature Granulocytes: 1 %
Lymphocytes Relative: 10 %
Lymphs Abs: 1.4 10*3/uL (ref 0.7–4.0)
MCH: 32.1 pg (ref 26.0–34.0)
MCHC: 31.8 g/dL (ref 30.0–36.0)
MCV: 100.9 fL — ABNORMAL HIGH (ref 80.0–100.0)
Monocytes Absolute: 0.6 10*3/uL (ref 0.1–1.0)
Monocytes Relative: 4 %
Neutro Abs: 11.8 10*3/uL — ABNORMAL HIGH (ref 1.7–7.7)
Neutrophils Relative %: 85 %
Platelets: 218 10*3/uL (ref 150–400)
RBC: 3.24 MIL/uL — ABNORMAL LOW (ref 3.87–5.11)
RDW: 13.2 % (ref 11.5–15.5)
WBC: 13.8 10*3/uL — ABNORMAL HIGH (ref 4.0–10.5)
nRBC: 0 % (ref 0.0–0.2)

## 2020-01-14 MED ORDER — ACETAMINOPHEN 325 MG PO TABS: 650.0000 mg | ORAL_TABLET | ORAL | 1 refills | Status: AC | PRN

## 2020-01-14 MED ORDER — LABETALOL HCL 300 MG PO TABS: 300.0000 mg | ORAL_TABLET | Freq: Three times a day (TID) | ORAL | 2 refills | Status: AC

## 2020-01-14 NOTE — Discharge Summary (Signed)
Patient ID: Tammie Singh MRN: [id number] DOB/AGE: 17-Aug-1985 34 y.o.  Admit date: 01/12/2020 Discharge date: January 14, 2019  Admission Diagnoses: Hypertension affecting pregnancy in third trimester [O16.3]  Anxiety  Discharge Diagnoses: Hypertension affecting pregnancy in third trimester [O16.3]        Anxiety  Discharged Condition: stable  Hospital Course: Patient mid on 35 with elevated blood pressures.  She received several rounds of IV labetalol to control her blood pressure.  Fetal testing remained reassuring.  She did have a increase in her LFTs to the 40s.  Given this and her blood pressures concern for developing preeclampsia and patient was admitted for observation.  Her oral labetalol was increased from 200 mg to 300 mg every 8 hours and she was started on Procardia 30 mg XL.  She received 1 dose of the Procardia on July 27 but subsequent doses were not administered.  After arriving to her antepartum room and given anxiolytics, blood pressures returned to the nonsevere range.  She remained without signs or symptoms of preeclampsia.  She received a complete course of betamethasone.  Repeat labs on 728 were stable with LFTs slightly lower but still in the 40s.  No other evidence of preeclampsia.  On the 28th she also had ultrasound which showed baby at the 5th percentile.  Adequate AFI, normal umbilical artery Dopplers, biophysical profile 8 out of 8 but this does constitute new onset intrauterine growth restriction.  On day of discharge on the July 29 patient noted no headache no right upper quadrant pain active fetal movement without vaginal bleeding.  Physical exam: Vitals with BMI 01/14/2020 01/14/2020 01/14/2020  Height - - -  Weight - - -  BMI - - -  Systolic 140 153 (No Data)  Diastolic 91 73 (No Data)  Pulse 72 77 -   General: Anxious but no distress Abdomen: No right upper quadrant pain, no fundal tenderness, gravid Lower extremity: 3+ DTR, trace edema GU:  Deferred Toco: None NST: 125's, positive accelerations, no decelerations, 10 beat variability  Consults: MFM  Treatments: IV hydration, steroids: Betamethasone and adjustments of antihypertensives  Disposition: home   Given patient has growth restriction I do not want to overly correct her hypertension and drop perfusion pressure to the placenta.  While the intent upon admission was to add Procardia to her regimen patient only had single dose of Procardia, unknown to me.  At this time patient has not had Procardia for over 36 hours and her blood pressures have remained in a stable but elevated and nonsevere range.  We will continue to plan for just the change in labetalol.  Allergies as of 01/14/2020   No Known Allergies     Medication List    TAKE these medications   acetaminophen 325 MG tablet Commonly known as: TYLENOL Take 2 tablets (650 mg total) by mouth every 4 (four) hours as needed (for pain scale < 4  OR  temperature  >/=  100.5 F).   aspirin EC 81 MG tablet Take 81 mg by mouth daily.   clonazePAM 0.5 MG tablet Commonly known as: KLONOPIN Take 0.5 mg by mouth 2 (two) times daily as needed for anxiety.   Ferralet 90 90-1 MG Tabs Take 1 tablet by mouth daily.   Folic Acid 0.8 MG Caps Take 0.8 mg by mouth daily.   labetalol 300 MG tablet Commonly known as: NORMODYNE Take 1 tablet (300 mg total) by mouth every 8 (eight) hours. What changed:   medication strength  how  much to take  when to take this   PRENATAL PO Take 1 tablet by mouth daily.   Trintellix 20 MG Tabs tablet Generic drug: vortioxetine HBr Take 20 mg by mouth daily.   Trintellix 10 MG Tabs tablet Generic drug: vortioxetine HBr Take 10 mg by mouth at bedtime.   VICKS VAPORUB EX Apply 1 application topically as needed (congestion).        Signed: Lendon Colonel, MD MD 01/14/2020, 9:05 AM

## 2020-01-14 NOTE — Progress Notes (Signed)
Pt discharged following repeat labs. Reviewed labetalol dosing changes, and signs and symptoms of hypertension.  Patient verbalized understanding. Pt to call office to f/u.

## 2020-01-14 NOTE — Plan of Care (Signed)
  Problem: Nutrition: Goal: Adequate nutrition will be maintained Outcome: Completed/Met   Problem: Elimination: Goal: Will not experience complications related to urinary retention Outcome: Completed/Met

## 2020-01-21 ENCOUNTER — Telehealth (HOSPITAL_COMMUNITY): Payer: Self-pay | Admitting: *Deleted

## 2020-01-21 NOTE — Telephone Encounter (Signed)
Preadmission screen  

## 2020-01-22 ENCOUNTER — Telehealth (HOSPITAL_COMMUNITY): Payer: Self-pay | Admitting: *Deleted

## 2020-01-22 ENCOUNTER — Encounter (HOSPITAL_COMMUNITY): Payer: Self-pay | Admitting: *Deleted

## 2020-01-22 NOTE — Telephone Encounter (Signed)
Preadmission screen  

## 2020-02-02 ENCOUNTER — Other Ambulatory Visit (HOSPITAL_COMMUNITY)
Admission: RE | Admit: 2020-02-02 | Discharge: 2020-02-02 | Disposition: A | Discharge status: Home / Self Care | Payer: BC Managed Care – PPO | Source: Ambulatory Visit | Attending: Obstetrics | Admitting: Obstetrics

## 2020-02-02 DIAGNOSIS — Z01812 Encounter for preprocedural laboratory examination: Secondary | ICD-10-CM | POA: Insufficient documentation

## 2020-02-02 DIAGNOSIS — Z20822 Contact with and (suspected) exposure to covid-19: Secondary | ICD-10-CM | POA: Insufficient documentation

## 2020-02-02 LAB — SARS CORONAVIRUS 2 (TAT 6-24 HRS): SARS Coronavirus 2: NEGATIVE

## 2020-02-03 ENCOUNTER — Other Ambulatory Visit: Payer: Self-pay | Admitting: Obstetrics

## 2020-02-04 ENCOUNTER — Inpatient Hospital Stay (HOSPITAL_COMMUNITY): Payer: BC Managed Care – PPO | Admitting: Anesthesiology

## 2020-02-04 ENCOUNTER — Inpatient Hospital Stay (HOSPITAL_COMMUNITY)
Admission: AD | Admit: 2020-02-04 | Discharge: 2020-02-07 | DRG: 807 | Disposition: A | Discharge status: Home / Self Care | Payer: BC Managed Care – PPO | Attending: Obstetrics and Gynecology | Admitting: Obstetrics and Gynecology

## 2020-02-04 ENCOUNTER — Inpatient Hospital Stay (HOSPITAL_COMMUNITY): Payer: BC Managed Care – PPO

## 2020-02-04 DIAGNOSIS — O365930 Maternal care for other known or suspected poor fetal growth, third trimester, not applicable or unspecified: Secondary | ICD-10-CM | POA: Diagnosis present

## 2020-02-04 DIAGNOSIS — O99344 Other mental disorders complicating childbirth: Secondary | ICD-10-CM | POA: Diagnosis present

## 2020-02-04 DIAGNOSIS — O1002 Pre-existing essential hypertension complicating childbirth: Secondary | ICD-10-CM | POA: Diagnosis present

## 2020-02-04 DIAGNOSIS — O163 Unspecified maternal hypertension, third trimester: Secondary | ICD-10-CM | POA: Diagnosis present

## 2020-02-04 DIAGNOSIS — F419 Anxiety disorder, unspecified: Secondary | ICD-10-CM | POA: Diagnosis present

## 2020-02-04 DIAGNOSIS — Z3490 Encounter for supervision of normal pregnancy, unspecified, unspecified trimester: Secondary | ICD-10-CM | POA: Diagnosis present

## 2020-02-04 DIAGNOSIS — Z3A37 37 weeks gestation of pregnancy: Secondary | ICD-10-CM | POA: Diagnosis not present

## 2020-02-04 DIAGNOSIS — O9902 Anemia complicating childbirth: Secondary | ICD-10-CM | POA: Diagnosis present

## 2020-02-04 DIAGNOSIS — Z20822 Contact with and (suspected) exposure to covid-19: Secondary | ICD-10-CM | POA: Diagnosis present

## 2020-02-04 DIAGNOSIS — D638 Anemia in other chronic diseases classified elsewhere: Secondary | ICD-10-CM | POA: Diagnosis present

## 2020-02-04 LAB — TYPE AND SCREEN
ABO/RH(D): O POS
Antibody Screen: NEGATIVE

## 2020-02-04 LAB — CBC
HCT: 34 % — ABNORMAL LOW (ref 36.0–46.0)
HCT: 35.3 % — ABNORMAL LOW (ref 36.0–46.0)
Hemoglobin: 10.9 g/dL — ABNORMAL LOW (ref 12.0–15.0)
Hemoglobin: 11.5 g/dL — ABNORMAL LOW (ref 12.0–15.0)
MCH: 31.7 pg (ref 26.0–34.0)
MCH: 31.9 pg (ref 26.0–34.0)
MCHC: 32.1 g/dL (ref 30.0–36.0)
MCHC: 32.6 g/dL (ref 30.0–36.0)
MCV: 98.8 fL (ref 80.0–100.0)
MCV: 97.8 fL (ref 80.0–100.0)
Platelets: 187 10*3/uL (ref 150–400)
Platelets: 189 10*3/uL (ref 150–400)
RBC: 3.44 MIL/uL — ABNORMAL LOW (ref 3.87–5.11)
RBC: 3.61 MIL/uL — ABNORMAL LOW (ref 3.87–5.11)
RDW: 12.6 % (ref 11.5–15.5)
RDW: 12.5 % (ref 11.5–15.5)
WBC: 9.2 10*3/uL (ref 4.0–10.5)
WBC: 10.9 10*3/uL — ABNORMAL HIGH (ref 4.0–10.5)
nRBC: 0 % (ref 0.0–0.2)
nRBC: 0 % (ref 0.0–0.2)

## 2020-02-04 LAB — BASIC METABOLIC PANEL
Anion gap: 10 (ref 5–15)
BUN: 16 mg/dL (ref 6–20)
CO2: 18 mmol/L — ABNORMAL LOW (ref 22–32)
Calcium: 8.8 mg/dL — ABNORMAL LOW (ref 8.9–10.3)
Chloride: 108 mmol/L (ref 98–111)
Creatinine, Ser: 0.86 mg/dL (ref 0.44–1.00)
GFR calc Af Amer: >60 mL/min (ref >60)
GFR calc non Af Amer: >60 mL/min (ref >60)
Glucose, Bld: 87 mg/dL (ref 70–99)
Potassium: 3.7 mmol/L (ref 3.5–5.1)
Sodium: 136 mmol/L (ref 135–145)

## 2020-02-04 LAB — CBC WITH DIFFERENTIAL/PLATELET
Abs Immature Granulocytes: 0.05 10*3/uL (ref 0.00–0.07)
Basophils Absolute: 0 10*3/uL (ref 0.0–0.1)
Basophils Relative: 1 %
Eosinophils Absolute: 0.1 10*3/uL (ref 0.0–0.5)
Eosinophils Relative: 1 %
HCT: 34.3 % — ABNORMAL LOW (ref 36.0–46.0)
Hemoglobin: 11.3 g/dL — ABNORMAL LOW (ref 12.0–15.0)
Immature Granulocytes: 1 %
Lymphocytes Relative: 21 %
Lymphs Abs: 1.7 10*3/uL (ref 0.7–4.0)
MCH: 32.3 pg (ref 26.0–34.0)
MCHC: 32.9 g/dL (ref 30.0–36.0)
MCV: 98 fL (ref 80.0–100.0)
Monocytes Absolute: 0.5 10*3/uL (ref 0.1–1.0)
Monocytes Relative: 7 %
Neutro Abs: 5.9 10*3/uL (ref 1.7–7.7)
Neutrophils Relative %: 69 %
Platelets: 164 10*3/uL (ref 150–400)
RBC: 3.5 MIL/uL — ABNORMAL LOW (ref 3.87–5.11)
RDW: 12.7 % (ref 11.5–15.5)
WBC: 8.4 10*3/uL (ref 4.0–10.5)
nRBC: 0 % (ref 0.0–0.2)

## 2020-02-04 LAB — HEPATIC FUNCTION PANEL
ALT: 26 U/L (ref 0–44)
AST: 37 U/L (ref 15–41)
Albumin: 2.8 g/dL — ABNORMAL LOW (ref 3.5–5.0)
Alkaline Phosphatase: 108 U/L (ref 38–126)
Bilirubin, Direct: <0.1 mg/dL (ref 0.0–0.2)
Total Bilirubin: 0.7 mg/dL (ref 0.3–1.2)
Total Protein: 5.8 g/dL — ABNORMAL LOW (ref 6.5–8.1)

## 2020-02-04 LAB — PROTEIN / CREATININE RATIO, URINE
Creatinine, Urine: 99.5 mg/dL
Protein Creatinine Ratio: 0.16 mg/mg{Cre} — ABNORMAL HIGH (ref 0.00–0.15)
Total Protein, Urine: 16 mg/dL

## 2020-02-04 MED ORDER — LIDOCAINE HCL (PF) 1 % IJ SOLN
INTRAMUSCULAR | Status: DC | PRN
  Administered 2020-02-04: 2 mL via EPIDURAL
  Administered 2020-02-04: 10 mL via EPIDURAL

## 2020-02-04 MED ORDER — FENTANYL CITRATE (PF) 100 MCG/2ML IJ SOLN
50.0000 ug | INTRAMUSCULAR | Status: DC | PRN
  Administered 2020-02-04 (×2): 100 ug via INTRAVENOUS
  Filled 2020-02-04: qty 2

## 2020-02-04 MED ORDER — OXYTOCIN-SODIUM CHLORIDE 30-0.9 UT/500ML-% IV SOLN
1.0000 m[IU]/min | INTRAVENOUS | Status: DC
  Administered 2020-02-04: 2 m[IU]/min via INTRAVENOUS
  Filled 2020-02-04: qty 500

## 2020-02-04 MED ORDER — LABETALOL HCL 5 MG/ML IV SOLN: 80.0000 mg | INTRAVENOUS | Status: DC | PRN

## 2020-02-04 MED ORDER — LACTATED RINGERS IV SOLN: INTRAVENOUS | Status: DC

## 2020-02-04 MED ORDER — LABETALOL HCL 5 MG/ML IV SOLN
20.0000 mg | Freq: Once | INTRAVENOUS | Status: AC
  Administered 2020-02-04: 20 mg via INTRAVENOUS
  Filled 2020-02-04: qty 4

## 2020-02-04 MED ORDER — MISOPROSTOL 200 MCG PO TABS
1000.0000 ug | ORAL_TABLET | Freq: Once | ORAL | Status: AC
  Administered 2020-02-04: 1000 ug via RECTAL

## 2020-02-04 MED ORDER — HYDRALAZINE HCL 20 MG/ML IJ SOLN: 10.0000 mg | INTRAMUSCULAR | Status: DC | PRN

## 2020-02-04 MED ORDER — EPHEDRINE 5 MG/ML INJ: 10.0000 mg | INTRAVENOUS | Status: DC | PRN

## 2020-02-04 MED ORDER — FENTANYL-BUPIVACAINE-NACL 0.5-0.125-0.9 MG/250ML-% EP SOLN
12.0000 mL/h | EPIDURAL | Status: DC | PRN
  Filled 2020-02-04: qty 250

## 2020-02-04 MED ORDER — MISOPROSTOL 200 MCG PO TABS
ORAL_TABLET | ORAL | Status: AC
  Filled 2020-02-04: qty 5

## 2020-02-04 MED ORDER — LABETALOL HCL 5 MG/ML IV SOLN
20.0000 mg | INTRAVENOUS | Status: DC | PRN
  Administered 2020-02-05: 20 mg via INTRAVENOUS

## 2020-02-04 MED ORDER — MAGNESIUM SULFATE BOLUS VIA INFUSION
4.0000 g | Freq: Once | INTRAVENOUS | Status: AC
  Administered 2020-02-04: 4 g via INTRAVENOUS
  Filled 2020-02-04: qty 1000

## 2020-02-04 MED ORDER — LACTATED RINGERS IV SOLN: 500.0000 mL | INTRAVENOUS | Status: DC | PRN

## 2020-02-04 MED ORDER — PHENYLEPHRINE 40 MCG/ML (10ML) SYRINGE FOR IV PUSH (FOR BLOOD PRESSURE SUPPORT)
80.0000 ug | PREFILLED_SYRINGE | INTRAVENOUS | Status: DC | PRN
  Filled 2020-02-04: qty 10

## 2020-02-04 MED ORDER — ONDANSETRON HCL 4 MG/2ML IJ SOLN: 4.0000 mg | Freq: Four times a day (QID) | INTRAMUSCULAR | Status: DC | PRN

## 2020-02-04 MED ORDER — LABETALOL HCL 5 MG/ML IV SOLN
40.0000 mg | INTRAVENOUS | Status: DC | PRN
  Filled 2020-02-04 (×2): qty 8

## 2020-02-04 MED ORDER — TRANEXAMIC ACID-NACL 1000-0.7 MG/100ML-% IV SOLN
INTRAVENOUS | Status: AC
  Filled 2020-02-04: qty 100

## 2020-02-04 MED ORDER — SOD CITRATE-CITRIC ACID 500-334 MG/5ML PO SOLN: 30.0000 mL | ORAL | Status: DC | PRN

## 2020-02-04 MED ORDER — LABETALOL HCL 5 MG/ML IV SOLN
40.0000 mg | INTRAVENOUS | Status: DC | PRN
  Administered 2020-02-05: 40 mg via INTRAVENOUS

## 2020-02-04 MED ORDER — LIDOCAINE HCL (PF) 1 % IJ SOLN: 30.0000 mL | INTRAMUSCULAR | Status: DC | PRN

## 2020-02-04 MED ORDER — SODIUM CHLORIDE 0.9 % IV SOLN: INTRAVENOUS | Status: DC

## 2020-02-04 MED ORDER — FENTANYL CITRATE (PF) 100 MCG/2ML IJ SOLN
INTRAMUSCULAR | Status: AC
  Filled 2020-02-04: qty 2

## 2020-02-04 MED ORDER — OXYTOCIN BOLUS FROM INFUSION
333.0000 mL | Freq: Once | INTRAVENOUS | Status: AC
  Administered 2020-02-04: 333 mL via INTRAVENOUS

## 2020-02-04 MED ORDER — LABETALOL HCL 200 MG PO TABS
300.0000 mg | ORAL_TABLET | Freq: Three times a day (TID) | ORAL | Status: DC
  Administered 2020-02-04 – 2020-02-05 (×2): 300 mg via ORAL
  Filled 2020-02-04 (×2): qty 1

## 2020-02-04 MED ORDER — ACETAMINOPHEN 325 MG PO TABS: 650.0000 mg | ORAL_TABLET | ORAL | Status: DC | PRN

## 2020-02-04 MED ORDER — LACTATED RINGERS IV SOLN: 500.0000 mL | Freq: Once | INTRAVENOUS | Status: DC

## 2020-02-04 MED ORDER — LABETALOL HCL 5 MG/ML IV SOLN
20.0000 mg | INTRAVENOUS | Status: DC | PRN
  Filled 2020-02-04: qty 4

## 2020-02-04 MED ORDER — PHENYLEPHRINE 40 MCG/ML (10ML) SYRINGE FOR IV PUSH (FOR BLOOD PRESSURE SUPPORT)
80.0000 ug | PREFILLED_SYRINGE | INTRAVENOUS | Status: DC | PRN
  Administered 2020-02-04: 80 ug via INTRAVENOUS

## 2020-02-04 MED ORDER — OXYTOCIN-SODIUM CHLORIDE 30-0.9 UT/500ML-% IV SOLN: 2.5000 [IU]/h | INTRAVENOUS | Status: DC

## 2020-02-04 MED ORDER — BUPIVACAINE HCL (PF) 0.75 % IJ SOLN
INTRAMUSCULAR | Status: DC | PRN
Start: 2020-02-04 — End: 2020-02-04
  Administered 2020-02-04: 12 mL/h via EPIDURAL

## 2020-02-04 MED ORDER — MAGNESIUM SULFATE 40 GM/1000ML IV SOLN
2.0000 g/h | INTRAVENOUS | Status: AC
  Administered 2020-02-05: 2 g/h via INTRAVENOUS
  Filled 2020-02-04 (×3): qty 1000

## 2020-02-04 MED ORDER — MISOPROSTOL 25 MCG QUARTER TABLET
25.0000 ug | ORAL_TABLET | ORAL | Status: AC | PRN
  Administered 2020-02-04 (×2): 25 ug via VAGINAL
  Filled 2020-02-04 (×2): qty 1

## 2020-02-04 MED ORDER — TERBUTALINE SULFATE 1 MG/ML IJ SOLN: 0.2500 mg | Freq: Once | INTRAMUSCULAR | Status: DC | PRN

## 2020-02-04 MED ORDER — DIPHENHYDRAMINE HCL 50 MG/ML IJ SOLN: 12.5000 mg | INTRAMUSCULAR | Status: DC | PRN

## 2020-02-04 NOTE — H&P (Signed)
Tammie Singh is a 34 y.o. G1P0 at [redacted]w[redacted]d presenting for IOL for worsening chronic htn in the setting of SGA. Pt notes rare contractions. Good fetal movement, No vaginal bleeding, not leaking fluid. No HA  [facility] at [facility] since 8 wks - Dated by 8 wk u/s, unsure LMP - chronic htn, no formal diagnosis prior to pregnancy but presented with very high bps- 190s/ 110s. Initial improvement with treating anxiety (prn Klonopin prior to appts and allowing support person to visits with her) and low dose labetalol. S/p cards consult. On labetalol with slow icnrease from 100tid to 300tid. Admission at 34 wks for BMZ and further eval, on evidence PEC. Weekly labs and fetal eval since, MFM recc 37 wk delivery - Anemia, on iron - IUGR. Last u/s 35 wks, 5'5/ 16%. AC 14%, nl dopplers Anxiety, on trintillex 10tid and rare Klonopin use   Prenatal Transfer Tool  Maternal Diabetes: No Genetic Screening: Normal Maternal Ultrasounds/Referrals: IUGR Fetal Ultrasounds or other Referrals:  Referred to Materal Fetal Medicine  Maternal Substance Abuse:  No Significant Maternal Medications:  None Significant Maternal Lab Results: Group B Strep negative     OB History    Gravida  1   Para      Term      Preterm      AB      Living        SAB      TAB      Ectopic      Multiple      Live Births             Past Medical History:  Diagnosis Date  . Anxiety   . Essential hypertension 09/11/2019  . Nephritis 2005  . OCD (obsessive compulsive disorder)   . Pregnancy 09/11/2019   Past Surgical History:  Procedure Laterality Date  . WISDOM TOOTH EXTRACTION  2005   Family History: family history includes Asthma in her mother; Atrial fibrillation in her father; CAD in her father; Diabetes in her father; Heart disease in her father; High blood pressure in her maternal grandfather and maternal grandmother; Irritable bowel syndrome in her mother; Lung cancer in her maternal  grandfather; Migraines in her sister. Social History:  reports that she has never smoked. She has never used smokeless tobacco. She reports previous alcohol use. She reports that she does not use drugs.  Review of Systems - Negative except anxiety   Dilation: 1 Effacement (%): 60 Station: -2 Exam by:: Ara Kussmaul, RN Blood pressure (!) 168/99, pulse (!) 59, temperature 98.6 F (37 C), temperature source Oral, resp. rate 18, height 5' 5.25" (1.657 m), weight 67.2 kg.   Prenatal labs: ABO, Rh: --/--/O POS (08/19 0028) Antibody: NEG (08/19 0028) Rubella: Immune (01/07 0000) RPR: Nonreactive (01/07 0000)  HBsAg: Negative (01/07 0000)  HIV: Non-reactive (01/07 0000)  GBS:   neg 1 hr Glucola 78  Genetic screening normal Panoram, nl AFP Anatomy US normal  CBC    Component Value Date/Time   WBC 8.4 02/04/2020 0652   RBC 3.50 (L) 02/04/2020 0652   HGB 11.3 (L) 02/04/2020 0652   HCT 34.3 (L) 02/04/2020 0652   PLT 164 02/04/2020 0652   MCV 98.0 02/04/2020 0652   MCH 32.3 02/04/2020 0652   MCHC 32.9 02/04/2020 0652   RDW 12.7 02/04/2020 0652   LYMPHSABS 1.7 02/04/2020 0652   MONOABS 0.5 02/04/2020 0652   EOSABS 0.1 02/04/2020 0652   BASOSABS 0.0 02/04/2020 6213  CMP     Component Value Date/Time   NA 137 01/14/2020 0937   K 3.8 01/14/2020 0937   CL 108 01/14/2020 0937   CO2 18 (L) 01/14/2020 0937   GLUCOSE 140 (H) 01/14/2020 0937   BUN 10 01/14/2020 0937   CREATININE 0.92 01/14/2020 0937   CALCIUM 8.4 (L) 01/14/2020 0937   PROT 5.8 (L) 01/14/2020 0937   ALBUMIN 2.8 (L) 01/14/2020 0937   AST 38 01/14/2020 0937   ALT 35 01/14/2020 0937   ALKPHOS 81 01/14/2020 0937   BILITOT 0.4 01/14/2020 0937   GFRNONAA >60 01/14/2020 0937   GFRAA >60 01/14/2020 0937     Assessment/Plan: 34 y.o. G1P0 at [redacted]w[redacted]d Chronic htn, white coat and anxiety component, r/o PEC, move to delivery - IOL cytotec o/n, pit in am GBS neg   Lendon Colonel 02/04/2020, 7:33 AM

## 2020-02-04 NOTE — Progress Notes (Signed)
Tammie Singh is a 34 y.o. G1P0 at [redacted]w[redacted]d by ultrasound admitted for induction of labor due to Hypertension.  Subjective:  Patient is doing well this morning, states she is feeling some cramping and occasional ctxs but overall comfortable. Denies any HA, vision changes, RUQ/epigastric pain, or increased swelling of her extremities.   Objective: BP (!) 168/99   Pulse (!) 59   Temp 98.6 F (37 C) (Oral)   Resp 18   Ht 5' 5.25" (1.657 m)   Wt 67.2 kg   BMI 24.46 kg/m  No intake/output data recorded. No intake/output data recorded.  FHT:  FHR: 130 bpm, variability: moderate,  accelerations:  Present,  decelerations:  Absent UC:   irregular, every 2-6 minutes SVE:   Dilation: 1 Effacement (%): 60 Station: -2 Exam by:: Ara Kussmaul, RN  Labs: Lab Results  Component Value Date   WBC 8.4 02/04/2020   HGB 11.3 (L) 02/04/2020   HCT 34.3 (L) 02/04/2020   MCV 98.0 02/04/2020   PLT 164 02/04/2020    Assessment / Plan: 34Y G2P0010 @ 37.1 IOL cHTN and IUGR now with persistent severe range BP's  Labor: s/p 2 doses of Misoprostol, changed from 0 to 1 Preeclampsia:  cHTN SI preE w/ SF by BP's stat labs ordered and pending, s/p 1 dose IV labetalol 20mg  with recheck of BP's still severe range, d/w pt and husband r/b of starting Magnesium at this time for seizure ppx, pt agrees with rec to start Mag: 4g loading bolus followed by 2g/hr mainteance dose. PO Labetalol 300mg  given early, cont to monitor BP's closely, emergent antihypertensive protocol ordered Fetal Wellbeing:  Category I Pain Control:  Labor support without medications I/D:  n/a Anticipated MOD:  NSVD    Tammie Singh A Tammie Singh 02/04/2020, 7:41 AM

## 2020-02-04 NOTE — Progress Notes (Signed)
S: Doing well, no complaints, pain increasing and now waiting for epidural  O: BP (!) 177/96   Pulse (!) 54   Temp 98.6 F (37 C) (Oral)   Resp (P) 16   Ht 5' 5.25" (1.657 m)   Wt 67.2 kg   BMI 24.46 kg/m   Vitals with BMI 02/04/2020 02/04/2020 02/04/2020  Height - - -  Weight - - -  BMI - - -  Systolic 177 157 850  Diastolic 96 93 89  Pulse 54 56 60      FHT:  FHR: 120s bpm, variability: moderate,  accelerations:  Present,  decelerations:  Absent UC:   Not tracing well SVE:   Dilation: 1 Effacement (%): 60 Station: -3 Exam by:: Dr. Conni Elliot  Ext os 3 cm, thin, foley bulb in  A / P:  34 y.o.  OB History  Gravida Para Term Preterm AB Living  1 0 0 0 0 0  SAB TAB Ectopic Multiple Live Births  0 0 0 0 0   at [redacted]w[redacted]d IOL chronic htn, worsening, no evidence PEC but exam or labs but given severe range bps Mag started. slow progress, still in latent labor. SROM, foley in, cont to increase pitocin  Fetal Wellbeing:  Category I Pain Control:  Epidural  Anticipated MOD:  NSVD  Lendon Colonel 02/04/2020, 3:33 PM

## 2020-02-04 NOTE — Anesthesia Procedure Notes (Signed)
Epidural Patient location during procedure: OB Start time: 02/04/2020 3:48 PM End time: 02/04/2020 3:58 PM  Staffing Anesthesiologist: Lannie Fields, DO Performed: anesthesiologist   Preanesthetic Checklist Completed: patient identified, IV checked, risks and benefits discussed, monitors and equipment checked, pre-op evaluation and timeout performed  Epidural Patient position: sitting Prep: DuraPrep and site prepped and draped Patient monitoring: continuous pulse ox, blood pressure, heart rate and cardiac monitor Approach: midline Location: L3-L4 Injection technique: LOR air  Needle:  Needle type: Tuohy  Needle gauge: 17 G Needle length: 9 cm Needle insertion depth: 4 cm Catheter type: closed end flexible Catheter size: 19 Gauge Catheter at skin depth: 9 cm Test dose: negative  Assessment Sensory level: T8 Events: blood not aspirated, injection not painful, no injection resistance, no paresthesia and negative IV test  Additional Notes Patient identified. Risks/Benefits/Options discussed with patient including but not limited to bleeding, infection, nerve damage, paralysis, failed block, incomplete pain control, headache, blood pressure changes, nausea, vomiting, reactions to medication both or allergic, itching and postpartum back pain. Confirmed with bedside nurse the patient's most recent platelet count. Confirmed with patient that they are not currently taking any anticoagulation, have any bleeding history or any family history of bleeding disorders. Patient expressed understanding and wished to proceed. All questions were answered. Sterile technique was used throughout the entire procedure. Please see nursing notes for vital signs. Test dose was given through epidural catheter and negative prior to continuing to dose epidural or start infusion. Warning signs of high block given to the patient including shortness of breath, tingling/numbness in hands, complete motor block,  or any concerning symptoms with instructions to call for help. Patient was given instructions on fall risk and not to get out of bed. All questions and concerns addressed with instructions to call with any issues or inadequate analgesia.  Reason for block:procedure for pain

## 2020-02-04 NOTE — Progress Notes (Signed)
Spoke to Dr. Ernestina Penna regarding recurrent late decelerations. Pitocin stopped, IV fluid bolus given and position changes done. Restart Pitocin in 30 minutes at 4 milliiunits.

## 2020-02-04 NOTE — Progress Notes (Signed)
Tammie Singh is a 34 y.o. G1P0 at [redacted]w[redacted]d admitted for induction of labor due to Hypertension.  Subjective: Patient doing well, does admit to feeling "loopy" on the magnesium, but no c/o HA, vision changes, RUQ/epigastric pain. Was able to take a nap. Some cramping, no regular painful ctxs.   Objective: BP 136/87   Pulse 64   Temp 98.6 F (37 C) (Oral)   Resp 16   Ht 5' 5.25" (1.657 m)   Wt 67.2 kg   BMI 24.46 kg/m  No intake/output data recorded. No intake/output data recorded.  FHT:  FHR: 130 bpm, variability: moderate,  accelerations:  Present,  decelerations:  Absent UC:   irregular, uterine irritability SVE:   Dilation: 1 Effacement (%): 60 Station: -3 Exam by:: Kris Hartmann, RN  Labs: Lab Results  Component Value Date   WBC 8.4 02/04/2020   HGB 11.3 (L) 02/04/2020   HCT 34.3 (L) 02/04/2020   MCV 98.0 02/04/2020   PLT 164 02/04/2020    Assessment / Plan: 34Y G1P0 @ 37.1 IOL cHTN IUGR now on Mag for SI preE w/ SF  Labor: Progressing normally, s/p 2 doses of Misoprostol, last dose ~530AM, now 1cm dilated, Cook balloon placed with 60cc, pt tolerated well, will start Pitocin 2x2 and titrate per protocol  Preeclampsia:  on magnesium sulfate. BP's improved, no severe ranges, review of preE labs this morning WNL, cont home antihypertensive regimen of PO Labetalol 300mg  TID Fetal Wellbeing:  Category I Pain Control:  Labor support without medications I/D:  n/a Anticipated MOD:  NSVD  Jessica Seidman A Symphany Fleissner 02/04/2020, 10:30 AM

## 2020-02-04 NOTE — Anesthesia Preprocedure Evaluation (Signed)
Anesthesia Evaluation  Patient identified by MRN, date of birth, ID band Patient awake    Reviewed: Allergy & Precautions, Patient's Chart, lab work & pertinent test results, reviewed documented beta blocker date and time   Airway Mallampati: II  TM Distance: >3 FB Neck ROM: Full    Dental no notable dental hx.    Pulmonary neg pulmonary ROS,    Pulmonary exam normal breath sounds clear to auscultation       Cardiovascular hypertension (gHTN), Pt. on medications and Pt. on home beta blockers Normal cardiovascular exam Rhythm:Regular Rate:Normal     Neuro/Psych PSYCHIATRIC DISORDERS Anxiety OCDnegative neurological ROS     GI/Hepatic negative GI ROS, Neg liver ROS,   Endo/Other  negative endocrine ROS  Renal/GU negative Renal ROS  negative genitourinary   Musculoskeletal negative musculoskeletal ROS (+)   Abdominal   Peds negative pediatric ROS (+)  Hematology  (+) Blood dyscrasia, anemia , hct 35.3, plt 189   Anesthesia Other Findings   Reproductive/Obstetrics (+) Pregnancy                             Anesthesia Physical Anesthesia Plan  ASA: II and emergent  Anesthesia Plan: Epidural   Post-op Pain Management:    Induction:   PONV Risk Score and Plan: 2  Airway Management Planned: Natural Airway  Additional Equipment: None  Intra-op Plan:   Post-operative Plan:   Informed Consent: I have reviewed the patients History and Physical, chart, labs and discussed the procedure including the risks, benefits and alternatives for the proposed anesthesia with the patient or authorized representative who has indicated his/her understanding and acceptance.       Plan Discussed with:   Anesthesia Plan Comments:         Anesthesia Quick Evaluation

## 2020-02-05 ENCOUNTER — Encounter (HOSPITAL_COMMUNITY): Payer: Self-pay | Admitting: Obstetrics

## 2020-02-05 ENCOUNTER — Other Ambulatory Visit: Payer: Self-pay

## 2020-02-05 LAB — COMPREHENSIVE METABOLIC PANEL
ALT: 21 U/L (ref 0–44)
AST: 36 U/L (ref 15–41)
Albumin: 2.5 g/dL — ABNORMAL LOW (ref 3.5–5.0)
Alkaline Phosphatase: 112 U/L (ref 38–126)
Anion gap: 9 (ref 5–15)
BUN: 10 mg/dL (ref 6–20)
CO2: 20 mmol/L — ABNORMAL LOW (ref 22–32)
Calcium: 6.3 mg/dL — CL (ref 8.9–10.3)
Chloride: 102 mmol/L (ref 98–111)
Creatinine, Ser: 0.85 mg/dL (ref 0.44–1.00)
GFR calc Af Amer: >60 mL/min (ref >60)
GFR calc non Af Amer: >60 mL/min (ref >60)
Glucose, Bld: 131 mg/dL — ABNORMAL HIGH (ref 70–99)
Potassium: 4.3 mmol/L (ref 3.5–5.1)
Sodium: 131 mmol/L — ABNORMAL LOW (ref 135–145)
Total Bilirubin: 1.1 mg/dL (ref 0.3–1.2)
Total Protein: 5.6 g/dL — ABNORMAL LOW (ref 6.5–8.1)

## 2020-02-05 LAB — CBC
HCT: 35.5 % — ABNORMAL LOW (ref 36.0–46.0)
HCT: 34 % — ABNORMAL LOW (ref 36.0–46.0)
Hemoglobin: 11.5 g/dL — ABNORMAL LOW (ref 12.0–15.0)
Hemoglobin: 11 g/dL — ABNORMAL LOW (ref 12.0–15.0)
MCH: 31.8 pg (ref 26.0–34.0)
MCH: 31.5 pg (ref 26.0–34.0)
MCHC: 32.4 g/dL (ref 30.0–36.0)
MCHC: 32.4 g/dL (ref 30.0–36.0)
MCV: 98.1 fL (ref 80.0–100.0)
MCV: 97.4 fL (ref 80.0–100.0)
Platelets: 189 10*3/uL (ref 150–400)
Platelets: 187 10*3/uL (ref 150–400)
RBC: 3.62 MIL/uL — ABNORMAL LOW (ref 3.87–5.11)
RBC: 3.49 MIL/uL — ABNORMAL LOW (ref 3.87–5.11)
RDW: 12.3 % (ref 11.5–15.5)
RDW: 12.5 % (ref 11.5–15.5)
WBC: 19 10*3/uL — ABNORMAL HIGH (ref 4.0–10.5)
WBC: 17.8 10*3/uL — ABNORMAL HIGH (ref 4.0–10.5)
nRBC: 0 % (ref 0.0–0.2)
nRBC: 0 % (ref 0.0–0.2)

## 2020-02-05 MED ORDER — LACTATED RINGERS IV SOLN: INTRAVENOUS | Status: DC

## 2020-02-05 MED ORDER — OXYCODONE HCL 5 MG PO TABS: 10.0000 mg | ORAL_TABLET | ORAL | Status: DC | PRN

## 2020-02-05 MED ORDER — WITCH HAZEL-GLYCERIN EX PADS: 1.0000 "application " | MEDICATED_PAD | CUTANEOUS | Status: DC | PRN

## 2020-02-05 MED ORDER — SENNOSIDES-DOCUSATE SODIUM 8.6-50 MG PO TABS
2.0000 | ORAL_TABLET | ORAL | Status: DC
  Administered 2020-02-05 – 2020-02-07 (×3): 2 via ORAL
  Filled 2020-02-05 (×3): qty 2

## 2020-02-05 MED ORDER — DIBUCAINE (PERIANAL) 1 % EX OINT: 1.0000 "application " | TOPICAL_OINTMENT | CUTANEOUS | Status: DC | PRN

## 2020-02-05 MED ORDER — OXYCODONE HCL 5 MG PO TABS: 5.0000 mg | ORAL_TABLET | ORAL | Status: DC | PRN

## 2020-02-05 MED ORDER — IBUPROFEN 600 MG PO TABS
600.0000 mg | ORAL_TABLET | Freq: Four times a day (QID) | ORAL | Status: DC
  Administered 2020-02-05 – 2020-02-07 (×10): 600 mg via ORAL
  Filled 2020-02-05 (×10): qty 1

## 2020-02-05 MED ORDER — LABETALOL HCL 200 MG PO TABS
300.0000 mg | ORAL_TABLET | Freq: Three times a day (TID) | ORAL | Status: DC
  Administered 2020-02-05: 300 mg via ORAL
  Filled 2020-02-05: qty 1

## 2020-02-05 MED ORDER — HYDRALAZINE HCL 20 MG/ML IJ SOLN
10.0000 mg | Freq: Once | INTRAMUSCULAR | Status: AC
  Administered 2020-02-05: 10 mg via INTRAVENOUS
  Filled 2020-02-05: qty 1

## 2020-02-05 MED ORDER — ZOLPIDEM TARTRATE 5 MG PO TABS: 5.0000 mg | ORAL_TABLET | Freq: Every evening | ORAL | Status: DC | PRN

## 2020-02-05 MED ORDER — LABETALOL HCL 200 MG PO TABS
300.0000 mg | ORAL_TABLET | Freq: Three times a day (TID) | ORAL | Status: DC
  Administered 2020-02-05 – 2020-02-07 (×5): 300 mg via ORAL
  Filled 2020-02-05 (×5): qty 1

## 2020-02-05 MED ORDER — BENZOCAINE-MENTHOL 20-0.5 % EX AERO
1.0000 "application " | INHALATION_SPRAY | CUTANEOUS | Status: DC | PRN
  Administered 2020-02-05: 1 via TOPICAL
  Filled 2020-02-05: qty 56

## 2020-02-05 MED ORDER — ONDANSETRON HCL 4 MG PO TABS: 4.0000 mg | ORAL_TABLET | ORAL | Status: DC | PRN

## 2020-02-05 MED ORDER — VORTIOXETINE HBR 20 MG PO TABS: 20.0000 mg | ORAL_TABLET | Freq: Every day | ORAL | Status: DC

## 2020-02-05 MED ORDER — ACETAMINOPHEN 325 MG PO TABS: 650.0000 mg | ORAL_TABLET | ORAL | Status: DC | PRN

## 2020-02-05 MED ORDER — VORTIOXETINE HBR 5 MG PO TABS
30.0000 mg | ORAL_TABLET | Freq: Every day | ORAL | Status: DC
  Administered 2020-02-05 – 2020-02-06 (×2): 30 mg via ORAL
  Filled 2020-02-05 (×3): qty 2

## 2020-02-05 MED ORDER — VORTIOXETINE HBR 5 MG PO TABS: 10.0000 mg | ORAL_TABLET | Freq: Every day | ORAL | Status: DC

## 2020-02-05 MED ORDER — ONDANSETRON HCL 4 MG/2ML IJ SOLN
4.0000 mg | INTRAMUSCULAR | Status: DC | PRN
  Administered 2020-02-05: 4 mg via INTRAVENOUS
  Filled 2020-02-05 (×2): qty 2

## 2020-02-05 MED ORDER — DIPHENHYDRAMINE HCL 25 MG PO CAPS: 25.0000 mg | ORAL_CAPSULE | Freq: Four times a day (QID) | ORAL | Status: DC | PRN

## 2020-02-05 MED ORDER — TETANUS-DIPHTH-ACELL PERTUSSIS 5-2.5-18.5 LF-MCG/0.5 IM SUSP: 0.5000 mL | Freq: Once | INTRAMUSCULAR | Status: DC

## 2020-02-05 MED ORDER — COCONUT OIL OIL: 1.0000 "application " | TOPICAL_OIL | Status: DC | PRN

## 2020-02-05 MED ORDER — SIMETHICONE 80 MG PO CHEW: 80.0000 mg | CHEWABLE_TABLET | ORAL | Status: DC | PRN

## 2020-02-05 MED ORDER — PRENATAL MULTIVITAMIN CH
1.0000 | ORAL_TABLET | Freq: Every day | ORAL | Status: DC
  Administered 2020-02-05 – 2020-02-07 (×3): 1 via ORAL
  Filled 2020-02-05 (×3): qty 1

## 2020-02-05 NOTE — Progress Notes (Addendum)
PPD # 1 S/P NSVD, on Magnesium sulfate for severe range BP  Live born female  Birth Weight: 5 lb 2.2 oz (2330 g) APGAR: 5, 8  Newborn Delivery   Birth date/time: 02/[birth year]/2020 23:13:00 Delivery type: Vaginal, Spontaneous     Baby name: Theron Arista Delivering provider: Noland Fordyce  Episiotomy:None   Lacerations:1st degree   Circumcision: Yes, planning  Feeding: breast  Pain control at delivery: Epidural   S:  Reports feeling well, slightly sore and tired from Magnesium. Bleeding is minimal, pain is well controlled. Denies headache, epigastric pain, or visual changes.              Tolerating po/ No nausea or vomiting             Bleeding is light             Pain controlled with acetaminophen and ibuprofen (OTC)             Up ad lib / ambulatory / voiding without difficulties   O:  A & O x 3, in no apparent distress              VS:  Vitals with BMI 02/05/2020 02/05/2020 02/05/2020  Height - - -  Weight - - -  BMI - - -  Systolic 151 132 376  Diastolic 94 79 96  Pulse 66 61 65    LABS:  Results for orders placed or performed during the hospital encounter of 02/04/20 (from the past 24 hour(s))  CBC     Status: Abnormal   Collection Time: 02/04/20 12:20 PM  Result Value Ref Range   WBC 10.9 (H) 4.0 - 10.5 K/uL   RBC 3.61 (L) 3.87 - 5.11 MIL/uL   Hemoglobin 11.5 (L) 12.0 - 15.0 g/dL   HCT 28.3 (L) 36 - 46 %   MCV 97.8 80.0 - 100.0 fL   MCH 31.9 26.0 - 34.0 pg   MCHC 32.6 30.0 - 36.0 g/dL   RDW 15.1 76.1 - 60.7 %   Platelets 189 150 - 400 K/uL   nRBC 0.0 0.0 - 0.2 %  CBC     Status: Abnormal   Collection Time: 02/05/20  1:51 AM  Result Value Ref Range   WBC 19.0 (H) 4.0 - 10.5 K/uL   RBC 3.62 (L) 3.87 - 5.11 MIL/uL   Hemoglobin 11.5 (L) 12.0 - 15.0 g/dL   HCT 37.1 (L) 36 - 46 %   MCV 98.1 80.0 - 100.0 fL   MCH 31.8 26.0 - 34.0 pg   MCHC 32.4 30.0 - 36.0 g/dL   RDW 06.2 69.4 - 85.4 %   Platelets 189 150 - 400 K/uL   nRBC 0.0 0.0 - 0.2 %  CBC     Status: Abnormal    Collection Time: 02/05/20  7:24 AM  Result Value Ref Range   WBC 17.8 (H) 4.0 - 10.5 K/uL   RBC 3.49 (L) 3.87 - 5.11 MIL/uL   Hemoglobin 11.0 (L) 12.0 - 15.0 g/dL   HCT 62.7 (L) 36 - 46 %   MCV 97.4 80.0 - 100.0 fL   MCH 31.5 26.0 - 34.0 pg   MCHC 32.4 30.0 - 36.0 g/dL   RDW 03.5 00.9 - 38.1 %   Platelets 187 150 - 400 K/uL   nRBC 0.0 0.0 - 0.2 %  Comprehensive metabolic panel     Status: Abnormal   Collection Time: 02/05/20  7:24 AM  Result Value Ref Range   Sodium  131 (L) 135 - 145 mmol/L   Potassium 4.3 3.5 - 5.1 mmol/L   Chloride 102 98 - 111 mmol/L   CO2 20 (L) 22 - 32 mmol/L   Glucose, Bld 131 (H) 70 - 99 mg/dL   BUN 10 6 - 20 mg/dL   Creatinine, Ser 6.94 0.44 - 1.00 mg/dL   Calcium 6.3 (LL) 8.9 - 10.3 mg/dL   Total Protein 5.6 (L) 6.5 - 8.1 g/dL   Albumin 2.5 (L) 3.5 - 5.0 g/dL   AST 36 15 - 41 U/L   ALT 21 0 - 44 U/L   Alkaline Phosphatase 112 38 - 126 U/L   Total Bilirubin 1.1 0.3 - 1.2 mg/dL   GFR calc non Af Amer >60 >60 mL/min   GFR calc Af Amer >60 >60 mL/min   Anion gap 9 5 - 15    Blood type: --/--/O POS (08/19 0028)  Rubella: Immune (01/07 0000)   I&O: I/O last 3 completed shifts: In: 1631.7 [P.O.:540; I.V.:1091.7] Out: 2950 [Urine:2150; Emesis/NG output:350; Blood:450]          Total I/O In: 281.3 [I.V.:281.3] Out: 750 [Urine:750]  Vaccines: TDaP UTD         Flu    UTD   Gen: AAO x 3, NAD  Abdomen: soft, non-tender, non-distended             Fundus: firm, non-tender, U-1   Perineum: repair intact, no edema  Lochia: light to moderate  Extremities: no edema, no calf pain or tenderness             2+ DTRs bilaterally             Lungs: clear throughout             Cardiac: Regular rhythm and rate   A/P: PPD # 1 34 y.o., G1P1001   Principal Problem:   Postpartum care following vaginal delivery (8/19) Active Problems:   Hypertension affecting pregnancy in third trimester   Encounter for induction of labor   SVD (spontaneous vaginal delivery)    First degree perineal laceration  Chronic Hypertension   - Magnesium sulfate infusing at 2gm/hr. Will discontinue today at 2300.   - Resumed PO Labetalol 300mg  TID   - IV labetalol protocol for severe range BP   - Strict I&Os  S/P NSVD   - Routine post partum orders   - Breastfeeding support prn   - Encourage rest when baby naps   , MSN, CNM 02/05/2020, 10:59 AM  Pt also seen by me.   Notes 'woozy' from [place], particularly when standing. No CP/ SOB. No HA. tol po. Voiding a lot.  Vitals:   02/05/20 0630 02/05/20 0804 02/05/20 1104 02/05/20 1106  BP:  (!) 151/94  (!) 146/96  Pulse:  66  69  Resp: 18 20  18   Temp:  98.5 F (36.9 C)  97.8 F (36.6 C)  TempSrc:    Oral  SpO2:  98% 99%   Weight:      Height:       Gen: well appearing, swelling in face Abd: soft distension, NT, tympany, uterus at umbilicus and firm GU: def LE: 1+ DTR, trace edema  Labs reviewed.  PP chronic h tn with gest exacerbation, severe range bps but no evidence PEC. COmplete 24 hrs Mag, stop at 11p tonight. Cont with labetalol 300 q8hrs but expect bps to spike once off Mag. Would add Procardia XL if that happens.  Anxiety, cont  home meds, good support  Lendon Colonel 02/05/2020 12:37 PM

## 2020-02-05 NOTE — Anesthesia Postprocedure Evaluation (Signed)
Anesthesia Post Note  Patient: Tammie Singh  Procedure(s) Performed: AN AD HOC LABOR EPIDURAL     Patient location during evaluation: Mother Baby Anesthesia Type: Epidural Level of consciousness: awake and alert and oriented Pain management: satisfactory to patient Vital Signs Assessment: post-procedure vital signs reviewed and stable Respiratory status: respiratory function stable Cardiovascular status: stable Postop Assessment: no headache, no backache, epidural receding, patient able to bend at knees, no signs of nausea or vomiting and adequate PO intake Anesthetic complications: no   No complications documented.  Last Vitals:  Vitals:   02/05/20 0520 02/05/20 0630  BP:    Pulse:    Resp: 18 18  Temp:    SpO2:      Last Pain:  Vitals:   02/05/20 0300  TempSrc: Oral  PainSc: 0-No pain   Pain Goal:                   June Rode

## 2020-02-05 NOTE — Lactation Note (Signed)
This note was copied from a baby's chart. Lactation Consultation Note  Patient Name: [name] WGNFA'O Date: 02/05/2020 Reason for consult: Initial assessment;Primapara;1st time breastfeeding;Infant < 6lbs;Early term 37-38.6wks  0903 - 0932 - LC conducted initial visit with Tammie Singh. She reports that her son, Tammie Singh, has latched to the breast. He was cueing while in the room, and I assisted her with latching him to the left breast in cross cradle to cradle hold. I showed her how to sandwich the breast. Tammie Singh has large nipples, and baby is able to latch easily. [name] was releasing his latch repeatedly; I checked, and he had a meconium diaper.  After I changed his diaper, I assisted with latching him in cross cradle to cradle hold on the right breast.  Baby has received some formula due to low blood sugar. I reviewed LPI protocol and based on risk factors (IUGR, hypoglycemia, early term), I recommended that in additional to breast feeding on demand 8-12 times a day, that Tammie Singh post-pump several times today and supplement 5-10 mls of EBM or ABM (or donor breast milk).  I reviewed benefits of breast milk and what to expect in the first four days of breast feeding. Tammie Singh reports that she is still feeling the effects of magnesium.  I recommended that both parents hold baby STS.  I set up DEBP and reviewed disassembly, cleaning and reassembly of pump equipment.  All questions answered at this time. Tammie Singh has a spectra pump at home.  Maternal Data Formula Feeding for Exclusion: No Has patient been taught Hand Expression?: Yes Does the patient have breastfeeding experience prior to this delivery?: No  Feeding Feeding Type: Breast Fed Nipple Type: Slow - flow  LATCH Score Latch: Repeated attempts needed to sustain latch, nipple held in mouth throughout feeding, stimulation needed to elicit sucking reflex.  Audible Swallowing: A few with  stimulation  Type of Nipple: Everted at rest and after stimulation  Comfort (Breast/Nipple): Soft / non-tender  Hold (Positioning): Assistance needed to correctly position infant at breast and maintain latch.  LATCH Score: 7  Interventions Interventions: Breast feeding basics reviewed;Assisted with latch;Hand express;Breast compression;Adjust position;Support pillows;DEBP  Lactation Tools Discussed/Used Tools: Pump Breast pump type: Double-Electric Breast Pump Pump Review: Setup, frequency, and cleaning Initiated by:: hl Date initiated:: 02/05/20   Consult Status Consult Status: Follow-up Date: 02/06/20 Follow-up type: In-patient    Tammie Singh 02/05/2020, 9:48 AM

## 2020-02-05 NOTE — Progress Notes (Signed)
MOB was referred for history of depression/anxiety. * Referral screened out by Clinical Social Worker because none of the following criteria appear to apply: ~ History of anxiety/depression during this pregnancy, or of post-partum depression following prior delivery. ~ Diagnosis of anxiety and/or depression within last 3 years OR * MOB's symptoms currently being treated with medication and/or therapy. Per further chart review, Mob on Trintellix for severe anxiety as well as has a psychiatrist and therapist per [facility] records.     Please contact the Clinical Social Worker if needs arise, by MOB request, or if MOB scores greater than 9/yes to question 10 on Edinburgh Postpartum Depression Screen.   Salim Forero S. Bassy Fetterly, MSW, LCSW [facility] [phone]   

## 2020-02-05 NOTE — Progress Notes (Signed)
Results for JAIMIE, PIPPINS (MRN [id number]) as of 02/05/2020 19:18  Ref. Range 02/05/2020 07:24  Calcium Latest Ref Range: 8.9 - 10.3 mg/dL 6.3 (LL)    Called above result to AYetta Barre, CNM.  No new orders received.

## 2020-02-06 MED ORDER — NIFEDIPINE ER OSMOTIC RELEASE 30 MG PO TB24
30.0000 mg | ORAL_TABLET | Freq: Every day | ORAL | Status: DC
  Administered 2020-02-06 – 2020-02-07 (×2): 30 mg via ORAL
  Filled 2020-02-06 (×2): qty 1

## 2020-02-06 NOTE — Progress Notes (Signed)
No c/o; feels much better after magnesium stopped Minimal bleeding; no h/a, vision changes, abd pain; says was anxious last night/"flustered" re: confusion with nursing about various things, says got briefly flustered earlier this am with lactation but she feels good and not stressed; breastfeeding  Patient Vitals for the past 24 hrs:  BP Temp Temp src Pulse Resp SpO2  02/06/20 1102 (!) 149/86 98.7 F (37.1 C) Oral 66 17 98 %  02/06/20 0900 (!) 143/77 98.5 F (36.9 C) Oral 66 16 98 %  02/06/20 0449 (!) 148/88 99.3 F (37.4 C) Oral (!) 59 18 97 %  02/05/20 2315 124/80 99.5 F (37.5 C) Oral 68 -- 97 %  02/05/20 2258 -- -- -- -- 18 --  02/05/20 2114 (!) 153/89 99 F (37.2 C) Oral 72 16 --  02/05/20 2033 (!) 180/105 -- -- 67 -- --  02/05/20 2013 (!) 174/101 99.5 F (37.5 C) Oral 63 18 97 %  02/05/20 2012 (!) 185/93 -- -- 62 -- --  02/05/20 1522 138/79 98.7 F (37.1 C) Oral (!) 58 18 100 %  02/05/20 1404 -- -- -- -- 20 --  02/05/20 1300 -- -- -- -- 18 --     Intake/Output Summary (Last 24 hours) at 02/06/2020 1250 Last data filed at 02/05/2020 2325 Gross per 24 hour  Intake 2505.06 ml  Output 2900 ml  Net -394.94 ml   Per pt some urine was dumped by her and not collected (occurred more than once)  A&ox3 rrr ctab Abd: soft, nt, nd; fundus firm and below umb LE: no edema, nt bilat  CBC Latest Ref Rng & Units 02/05/2020 02/05/2020 02/04/2020  WBC 4.0 - 10.5 K/uL 17.8(H) 19.0(H) 10.9(H)  Hemoglobin 12.0 - 15.0 g/dL 11.0(L) 11.5(L) 11.5(L)  Hematocrit 36 - 46 % 34.0(L) 35.5(L) 35.3(L)  Platelets 150 - 400 K/uL 187 189 189   A/P: ppd 2 s/p svd 1. Severe pre-e: s/p magnesium sulfate x24 hrs, asymptomatic; bps have started in increase to mild range this am, severe range last night when labetalol was due, also received hydralazine and then bp wnl; contin labetalol 300mg  tid but will add procardia 30xl, labs have been normal and no plan to repeat, plan for d/c home tomorrow pending  better bp control 2. Chronic anemia with small acute change - plan iron q day pp 3. Anxiety - monitor closely and plan close pp f/u

## 2020-02-06 NOTE — Lactation Note (Signed)
This note was copied from a baby's chart. Lactation Consultation Note  Patient Name: Boy Oluwadarasimi Favor VEHMC'N Date: 02/06/2020 Reason for consult: Primapara;1st time breastfeeding;Early term 37-38.6wks;Follow-up assessment;Infant weight loss  Visited with mom of a 52 hours old ETI female < 6 lbs. Mom is currently pumping and also supplementing with Similac 22 calorie formula. Mom woke up when [facility] came in the room, LC noticed that baby was lying down on his bassinet in a pool of emesis/formula and offered mother to change baby's blankets and bedding. Mom agreed, when [facility] pointed out that 30 ml was probably too much to feed this early on to a baby < 6 ml mom got upset and asked: "I wish you and my nurse were on the same page, this is what she told me to give him".  LC brought the green handout for LPI guidelines and counseled mom on supplementation guidelines and amounts for LPI. Mom got upset again and said she hasn't been able to even get up from her bed to look at that green sheet and said she was done with lactation, she asked LC to leave because "baby was doing fine". RN Aurther Loft notified; she reported that mother might not be discharged today after all. MOB aware of LC services in case she wishes to see lactation again.   Maternal Data    Feeding Feeding Type: Breast Fed  LATCH Score                   Interventions Interventions: Breast feeding basics reviewed  Lactation Tools Discussed/Used     Consult Status Consult Status: Follow-up Date: 02/07/20 Follow-up type: In-patient    Amiria Orrison Venetia Constable 02/06/2020, 8:14 AM

## 2020-02-07 LAB — RPR: RPR Ser Ql: NONREACTIVE — AB

## 2020-02-07 MED ORDER — NIFEDIPINE ER 30 MG PO TB24: 30.0000 mg | ORAL_TABLET | Freq: Every day | ORAL | 1 refills | Status: AC

## 2020-02-07 MED ORDER — IBUPROFEN 600 MG PO TABS: 600.0000 mg | ORAL_TABLET | Freq: Four times a day (QID) | ORAL | 0 refills | Status: AC

## 2020-02-07 NOTE — Progress Notes (Signed)
Discharge instructions given to patient regarding medications, when to call MD/go to [facility]U, signes/symptoms of pre-e, postpartum course, and to call and schedule follow up appointment with OB. Patient asked appropriate questions and left in stable condition.

## 2020-02-07 NOTE — Progress Notes (Signed)
° °  Patient Vitals for the past 24 hrs:  BP Temp Temp src Pulse Resp SpO2  02/07/20 0845 (!) 138/95 -- -- (!) 57 -- --  02/07/20 0829 (!) 171/100 98.6 F (37 C) Oral 61 18 100 %  02/07/20 0513 (!) 141/96 98.8 F (37.1 C) Oral 60 18 100 %  02/06/20 2346 (!) 148/79 -- -- (!) 55 -- --  02/06/20 2345 (!) 160/77 98.5 F (36.9 C) Oral (!) 56 18 100 %  02/06/20 2206 135/76 -- -- 60 -- --  02/06/20 2048 (!) 152/85 98.9 F (37.2 C) Oral (!) 55 18 100 %  02/06/20 1617 132/79 99.4 F (37.4 C) Oral -- 18 100 %  02/06/20 1314 (!) 149/86 -- -- 66 -- --  02/06/20 1102 (!) 149/86 98.7 F (37.1 C) Oral 66 17 98 %    Intake/Output Summary (Last 24 hours) at 02/07/2020 1042 Last data filed at 02/06/2020 2230 Gross per 24 hour  Intake 380 ml  Output 3775 ml  Net -3395 ml      A&ox3 rrr ctab Abd: soft, nt, nd; fundus firm and below umb LE: no edema, nt bilat  CBC Latest Ref Rng & Units 02/05/2020 02/05/2020 02/04/2020  WBC 4.0 - 10.5 K/uL 17.8(H) 19.0(H) 10.9(H)  Hemoglobin 12.0 - 15.0 g/dL 11.0(L) 11.5(L) 11.5(L)  Hematocrit 36 - 46 % 34.0(L) 35.5(L) 35.3(L)  Platelets 150 - 400 K/uL 187 189 189   CMP Latest Ref Rng & Units 02/05/2020 02/04/2020 01/14/2020  Glucose 70 - 99 mg/dL 892(J) 87 194(R)  BUN 6 - 20 mg/dL 10 16 10   Creatinine 0.44 - 1.00 mg/dL 7.40 8.14  Sodium 135 - 145 mmol/L 131(L) 136 137  Potassium 3.5 - 5.1 mmol/L 4.3 3.7 3.8  Chloride 98 - 111 mmol/L 102 108 108  CO2 22 - 32 mmol/L 20(L) 18(L) 18(L)  Calcium 8.9 - 10.3 mg/dL 6.3(LL) 8.8(L) 8.4(L)  Total Protein 6.5 - 8.1 g/dL 4.81) 8.5(U) 3.1(S)  Total Bilirubin 0.3 - 1.2 mg/dL 1.1 0.7 0.4  Alkaline Phos 38 - 126 U/L 112 108 81  AST 15 - 41 U/L 36 37 38  ALT 0 - 44 U/L 21 26 35   A/P: ppd 4 s/p svd 1. Severe pre-e: s/p 24 hr mag sulfate, nml lfts, kidney fcn, plts; labetalol 300mg  po tid, procardia 30mg  xl started 1 day ago; labile bps, did have couple severe range, one was this am with 138/95 15 min for repeat; has  had good diuresis; plan d/c home today with office f/u in 2-3 days for bp check, pt also has bp cuff at home and reviewed when to call office about high bps; of note, suspect anxiety is also component to elevated bps at times 2. Chronic anemia with mild acute change - iron q day, asymptomatic 3. Rh pos 4. RI 5. Anxiety - stable, contin close f/u pp; contin trintillex q day

## 2020-02-07 NOTE — Lactation Note (Signed)
This note was copied from a baby's chart. Lactation Consultation Note  Patient Name: Tammie Singh Date: 02/07/2020 Reason for consult: Follow-up assessment   Infant is 60 hours old 37 weeks < 5 lbs with 9% weight loss.  LC talked with RN prior to meeting with Mom. RN states Mom is no longer providing formula. I asked the RN to about the I/O's sheet as only 1 urine/ 1 stool listed in the chart.   LC discussed with Mom how feeding was going. Mom states he has been latching well and exclusively breastfeeding. She states she d/c the use of formula, and her choice is to breastfeed only at this time. Mom is aware infant has a 9% weight loss. LC asked Mom if she was pumping. She stated she pumped 2x yesterday and 1 x today. I asked Mom how she plans to give the expressed breast milk to infant, she states when she gets home she will use bottles. LC offered Mom slow flow nipples and reviewed the importance of paced bottle technique to give infant expressed breast milk for extra calories due to his size.   LC attempted to review importance of pumping and adding additional volume after breastfeeding. Mom stated she had been given the LPTI guidelines and did not have a good experience with lactation in reference to the information provided in the form.   I asked Mom about the infants I/O's and she states infant has 2 voids/ 2 stools today.   Mom has a DEBP, Spectra, at home and plans to pump after nursing when she gets home. 1. Information provided on engorgement, pump storage, cleaning, signs of milk transfer and how to keep the infant active at the breast during feedings. Mom already aware of importance of feeding infant 8-12 in 24 hour period.  2. Mom states she will follow up with either Dr. Excell Seltzer and/or Dr. Earlene Plater upon discharge.  3. LC asked if the PMD's had lactation services within the practice, she states she did not have a good experience with lactation and will f/u with  Pediatrician at this time. 4. LC provided Mom with brochure for information on outpatient services and support. Mom received the brochure.    Maternal Data    Feeding Feeding Type: Breast Fed  LATCH Score                   Interventions    Lactation Tools Discussed/Used     Consult Status Consult Status: Complete Date: 02/07/20    Leocadia Idleman  Nicholson-Springer 02/07/2020, 11:35 AM

## 2020-02-07 NOTE — Discharge Summary (Signed)
Postpartum Discharge Summary  Date of Service updated     Patient Name: Tammie Singh DOB: 1986/03/28 MRN: [id number]  Date of admission: 02/04/2020 Delivery date:02/04/2020  Delivering provider: Noland Fordyce  Date of discharge: 02/07/2020  Admitting diagnosis: Encounter for induction of labor [Z34.90] Intrauterine pregnancy: [redacted]w[redacted]d     Secondary diagnosis:  Principal Problem:   Postpartum care following vaginal delivery (8/19) Active Problems:   Hypertension affecting pregnancy in third trimester   Encounter for induction of labor   SVD (spontaneous vaginal delivery)   First degree perineal laceration  Additional problems: anxiety    Discharge diagnosis: Term Pregnancy Delivered, Preeclampsia (severe), Anemia and anxiety                                              Post partum procedures:none Augmentation: AROM and Pitocin Complications: None  Hospital course: Induction of Labor With Vaginal Delivery   34 y.o. yo G1P1001 at 110w1d was admitted to the hospital 02/04/2020 for induction of labor.  Indication for induction: Preeclampsia.  Patient had an uncomplicated labor course as follows: Membrane Rupture Time/Date: 11:45 AM ,02/04/2020   Delivery Method:Vaginal, Spontaneous  Episiotomy: None  Lacerations:  1st degree  Details of delivery can be found in separate delivery note.  Patient had a routine postpartum course. Patient is discharged home 02/07/20.  Newborn Data: Birth date:02/04/2020  Birth time:11:13 PM  Gender:Female  Living status:Living  Apgars:5 ,8  Weight:2330 g   Magnesium Sulfate received: Yes: Seizure prophylaxis BMZ received: No Rhophylac:No  Physical exam  Vitals:   02/06/20 2346 02/07/20 0513 02/07/20 0829 02/07/20 0845  BP: (!) 148/79 (!) 141/96 (!) 171/100 (!) 138/95  Pulse: (!) 55 60 61 (!) 57  Resp:  18 18   Temp:  98.8 F (37.1 C) 98.6 F (37 C)   TempSrc:  Oral Oral   SpO2:  100% 100%   Weight:      Height:        General: alert, cooperative and no distress Lochia: appropriate Uterine Fundus: firm Incision: N/A DVT Evaluation: No evidence of DVT seen on physical exam. Labs: Lab Results  Component Value Date   WBC 17.8 (H) 02/05/2020   HGB 11.0 (L) 02/05/2020   HCT 34.0 (L) 02/05/2020   MCV 97.4 02/05/2020   PLT 187 02/05/2020   CMP Latest Ref Rng & Units 02/05/2020  Glucose 70 - 99 mg/dL 924(Q)  BUN 6 - 20 mg/dL 10  Creatinine 6.83 - 4.19 mg/dL 6.22  Sodium 297 - 989 mmol/L 131(L)  Potassium 3.5 - 5.1 mmol/L 4.3  Chloride 98 - 111 mmol/L 102  CO2 22 - 32 mmol/L 20(L)  Calcium 8.9 - 10.3 mg/dL 6.3(LL)  Total Protein 6.5 - 8.1 g/dL 2.1(J)  Total Bilirubin 0.3 - 1.2 mg/dL 1.1  Alkaline Phos 38 - 126 U/L 112  AST 15 - 41 U/L 36  ALT 0 - 44 U/L 21   Edinburgh Score: Edinburgh Postnatal Depression Scale Screening Tool 02/06/2020  I have been able to laugh and see the funny side of things. 0  I have looked forward with enjoyment to things. 0  I have blamed myself unnecessarily when things went wrong. 2  I have been anxious or worried for no good reason. 2  I have felt scared or panicky for no good reason. 0  Things have been getting  on top of me. 1  I have been so unhappy that I have had difficulty sleeping. 0  I have felt sad or miserable. 0  I have been so unhappy that I have been crying. 0  The thought of harming myself has occurred to me. 0  Edinburgh Postnatal Depression Scale Total 5      After visit meds:  Allergies as of 02/07/2020   No Known Allergies     Medication List    STOP taking these medications   aspirin EC 81 MG tablet     TAKE these medications   acetaminophen 325 MG tablet Commonly known as: TYLENOL Take 2 tablets (650 mg total) by mouth every 4 (four) hours as needed (for pain scale < 4  OR  temperature  >/=  100.5 F).   calcium carbonate 500 MG chewable tablet Commonly known as: TUMS - dosed in mg elemental calcium Chew 1 tablet by mouth at  bedtime as needed for indigestion or heartburn.   clonazePAM 0.5 MG tablet Commonly known as: KLONOPIN Take 0.5 mg by mouth 2 (two) times daily as needed for anxiety.   Ferralet 90 90-1 MG Tabs Take 1 tablet by mouth daily.   Folic Acid 0.8 MG Caps Take 0.8 mg by mouth daily.   ibuprofen 600 MG tablet Commonly known as: ADVIL Take 1 tablet (600 mg total) by mouth every 6 (six) hours.   labetalol 300 MG tablet Commonly known as: NORMODYNE Take 1 tablet (300 mg total) by mouth every 8 (eight) hours.   NIFEdipine 30 MG 24 hr tablet Commonly known as: ADALAT CC Take 1 tablet (30 mg total) by mouth daily. Start taking on: February 08, 2020   PRENATAL PO Take 1 tablet by mouth daily.   Trintellix 20 MG Tabs tablet Generic drug: vortioxetine HBr Take 20 mg by mouth at bedtime. (take with 10mg  tablet to equal a total dose of 30mg )   Trintellix 10 MG Tabs tablet Generic drug: vortioxetine HBr Take 10 mg by mouth at bedtime. (take with 20mg  tablet to equal a total dose of 30mg )   VICKS VAPORUB EX Apply 1 application topically as needed (congestion).        Discharge home in stable condition Infant Feeding: Breast Infant Disposition:home with mother Discharge instruction: per After Visit Summary and Postpartum booklet. Activity: Advance as tolerated. Pelvic rest for 6 weeks.  Diet: routine diet Anticipated Birth Control: Unsure Postpartum Appointment:6wks Additional Postpartum F/U: BP check 2-3 days Future Appointments:No future appointments. Follow up Visit:  Follow-up Information    , MD Follow up.   Specialty: Obstetrics and Gynecology Contact information: [address] [place] Noland Fordyce [phone]                   02/07/2020 [place], MD

## 2020-02-08 LAB — SURGICAL PATHOLOGY

## 2020-03-22 NOTE — Addendum Note (Signed)
Addended by: Berenice Bouton on: 03/22/2020 01:11 PM   Modules accepted: Orders

## 2021-08-05 IMAGING — US US MFM OB DETAIL+14 WK
1 series · 15 of 28 positions shown · non-contrast
Comparison: none

[Series 1: us mfm ob detail+14 wk · 75 acquisitions, 15 frames shown]
[im 1/75]
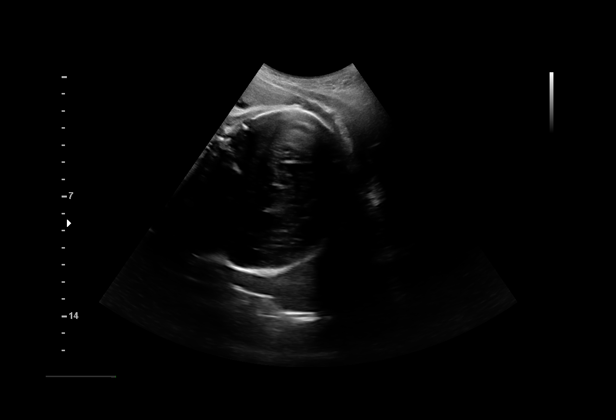
[im 6/75]
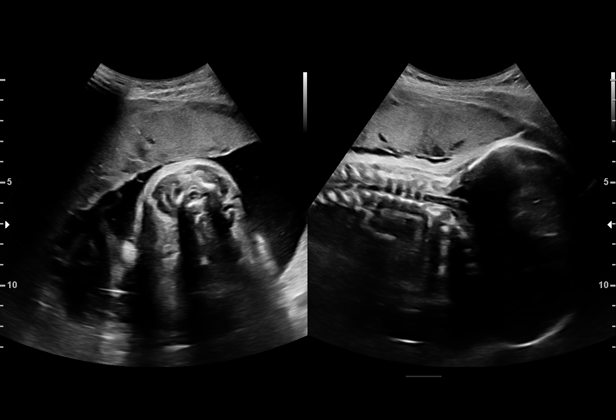
[im 11/75]
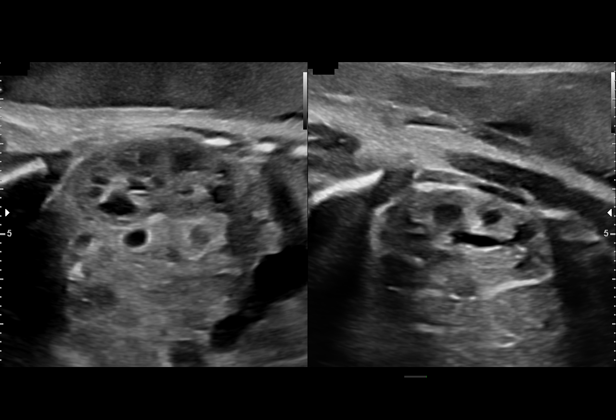
[im 17/75]
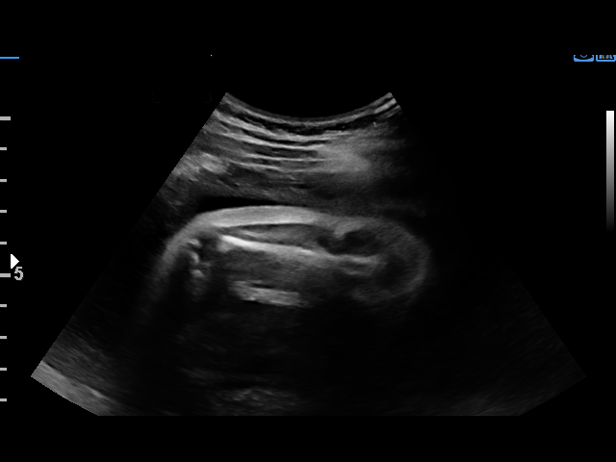
[im 22/75]
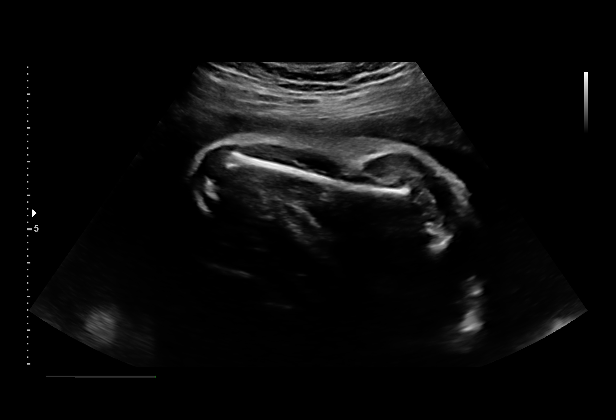
[im 28/75]
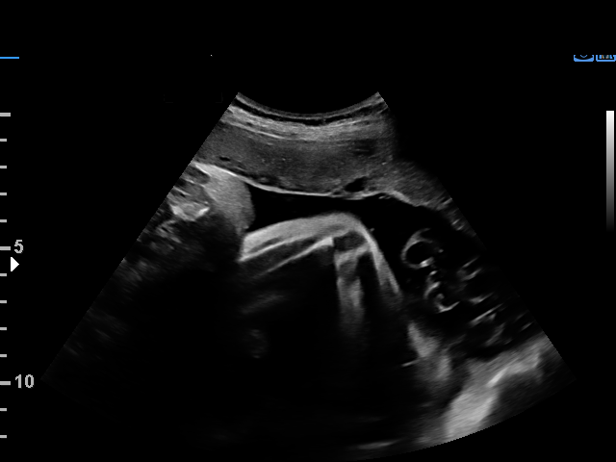
[im 33/75]
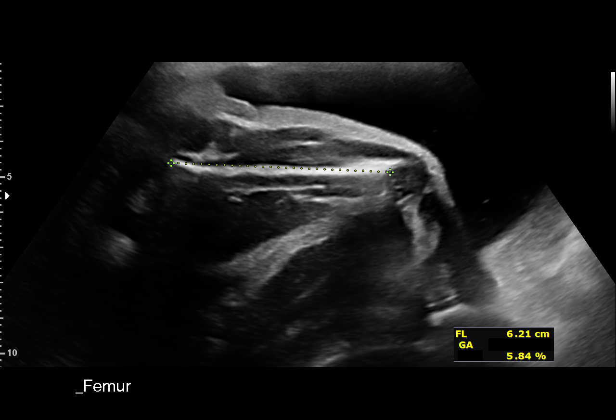
[im 39/75]
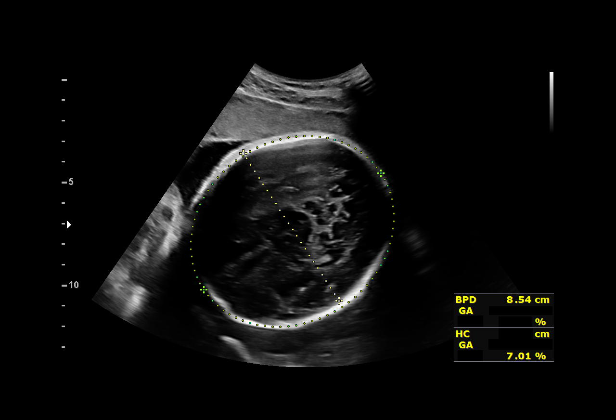
[im 42/75]
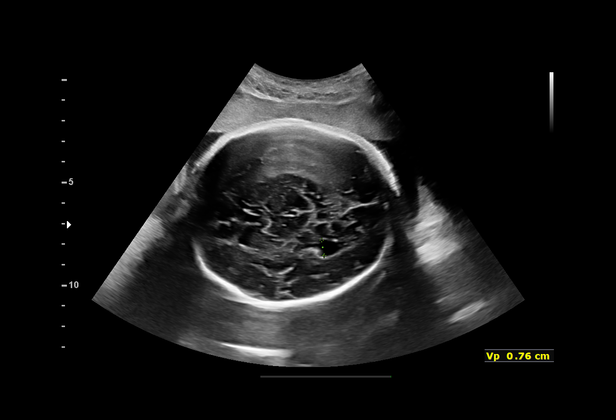
[im 47/75]
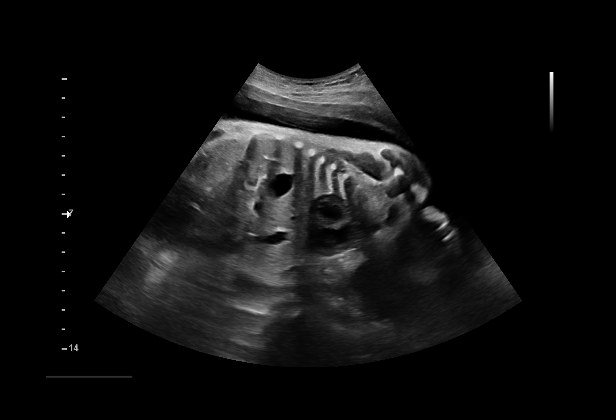
[im 53/75]
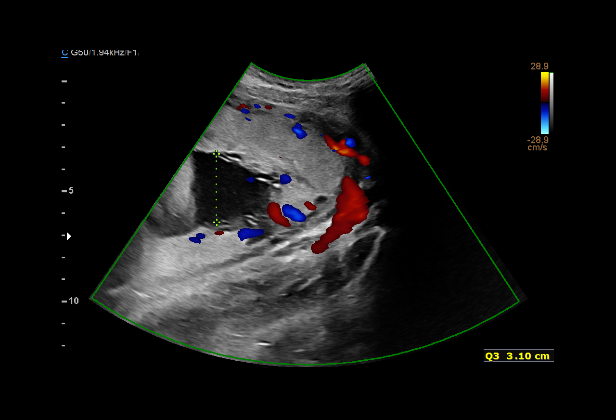
[im 58/75]
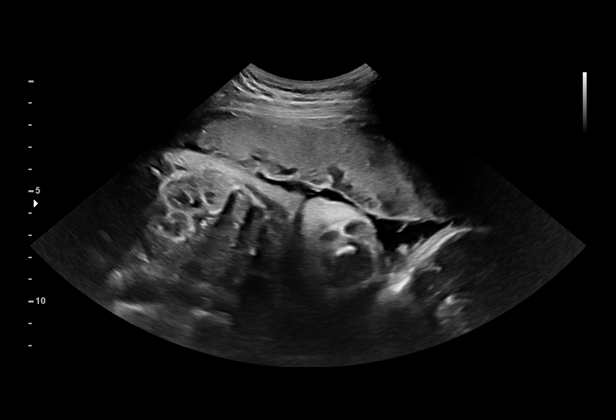
[im 64/75]
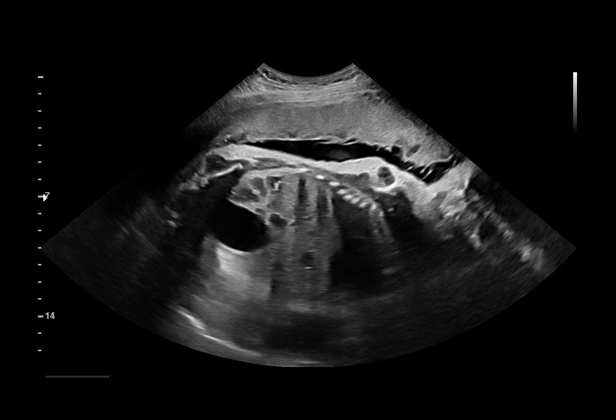
[im 69/75]
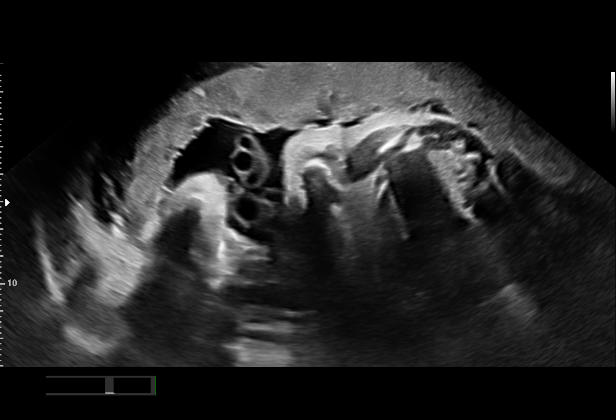
[im 75/75]
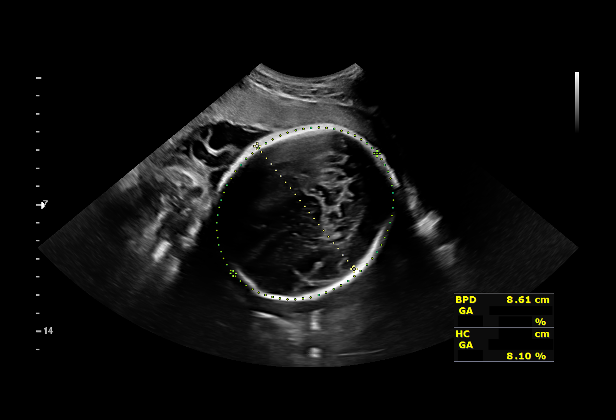

[15 of 28 positions shown; findings below may reference images not displayed]

3  US MFM UA CORD DOPPLER                76820.02    COOK ZAYAN

Indications

 Maternal care for known or suspected poor
 fetal growth, third trimester, not applicable or
 unspecified IUGR
 Gestational hypertension without significant
 proteinuria, third trimester (Labetalol)
 34 weeks gestation of pregnancy
 Encounter for antenatal screening for
 malformations
Fetal Evaluation

 Num Of Fetuses:         1
 Fetal Heart Rate(bpm):  120
 Cardiac Activity:       Observed
 Presentation:           Cephalic
 Placenta:               Anterior
 P. Cord Insertion:      Not well visualized

 Amniotic Fluid
 AFI FV:      Within normal limits

 AFI Sum(cm)     %Tile       Largest Pocket(cm)
 18.4            68

 RUQ(cm)       RLQ(cm)       LUQ(cm)        LLQ(cm)
 5
Biophysical Evaluation

 Amniotic F.V:   Within normal limits       F. Tone:        Observed
 F. Movement:    Observed                   Score:          [DATE]
 F. Breathing:   Observed
Biometry

 BPD:        86  mm     G. Age:  34w 5d         68  %    CI:        79.81   %    70 - 86
                                                         FL/HC:      20.8   %    19.4 -
 HC:      304.2  mm     G. Age:  33w 6d         13  %    HC/AC:      1.14        0.96 -
 AC:      267.6  mm     G. Age:  30w 6d        < 1  %    FL/BPD:     73.7   %    71 - 87
 FL:       63.4  mm     G. Age:  32w 5d         13  %    FL/AC:      23.7   %    20 - 24
 HUM:      54.4  mm     G. Age:  31w 4d          9  %
 LV:        7.6  mm

 Est. FW:    6601  gm      4 lb 3 oz      5  %
OB History

 Gravidity:    1
Gestational Age

 Clinical EDD:  34w 0d                                        EDD:   02/24/20
 U/S Today:     33w 0d                                        EDD:   03/02/20
 Best:          34w 0d     Det. By:  Clinical EDD             EDD:   02/24/20
Anatomy

 Cranium:               Appears normal         LVOT:                   Not well visualized
 Cavum:                 Not well visualized    Aortic Arch:            Appears normal
 Ventricles:            Appears normal         Ductal Arch:            Not well visualized
 Choroid Plexus:        Appears normal         Diaphragm:              Appears normal
 Cerebellum:            Not well visualized    Stomach:                Appears normal, left
                                                                       sided
 Posterior Fossa:       Not well visualized    Abdomen:                Appears normal
 Nuchal Fold:           Not applicable (>20    Abdominal Wall:         Not well visualized
                        wks GA)
 Face:                  Not well visualized    Cord Vessels:           Appears normal (3
                                                                       vessel cord)
 Lips:                  Not well visualized    Kidneys:                Appear normal
 Palate:                Not well visualized    Bladder:                Appears normal
 Thoracic:              Appears normal         Spine:                  Ltd views no
                                                                       intracranial signs of
                                                                       NTD
 Heart:                 Not well visualized    Upper Extremities:      Visualized
 RVOT:                  Not well visualized    Lower Extremities:      Visualized

 Other:  Technically difficult due to advanced GA and fetal position.  Hands
         and feet nwv.
Doppler - Fetal Vessels

 Umbilical Artery
  S/D     %tile                                              ADFV    RDFV
  2.74       63                                                 No      No
Cervix Uterus Adnexa

 Cervix
 Not visualized (advanced GA >10wks)
Comments

 Klpigbb Moolman is a 33-year-old gravida 1 para 0
 currently at 34 weeks and 0 days.  She was admitted last
 night after severe range blood pressures requiring multiple
 doses of IV labetalol for treatment was noted when she
 presented to the [facility].  She was sent to the [facility] for
 evaluation after elevated blood pressures were noted during
 her routine office visit yesterday.  The patient has a history of
 chronic hypertension that had been treated with labetalol 200
 mg 3 times a day.  She also has a history of severe anxiety
 that has been treated with 3 different anxiolytic medications.
 Due to her severe range blood pressures, she was admitted
 to the hospital for observation. At the time of admission, her
 PIH labs showed an elevated AST and ALT level in the low
 40s.  Her P/C ratio was unable to be calculated due to low
 protein levels.  She currently has a 24-hour urine collection
 underway. She was given a complete course of antenatal
 corticosteroids.  Her labetalol dose was increased to 300 mg
 3 times a day and she was also started on Procardia 30 mg
 XL daily.
 Overnight her fetal status has been reassuring.  The patient's
 blood pressures overnight were in the 120s to 130s over 70s
 to 80s range.  She denies any signs or symptoms related to
 severe preeclampsia.
 The patient had an ultrasound performed today showing an
 EFW of 4 pounds 3 ounces (5th percentile for her gestational
 age) indicating fetal growth restriction.  There was normal
 amniotic fluid noted with a total AFI of 18.4 cm.  A biophysical
 profile performed today was [DATE]. Doppler studies of the
 umbilical arteries performed today showed a normal S/D ratio
 of 2.74.  There were no signs of absent or reversed end-
 diastolic flow.
 The views of the fetal anatomy were limited today due to her
 advanced gestational age.
 Other than chronic hypertension and severe anxiety, the
 patient denies any other significant past medical history.  She
 denies any significant past surgical history.
 The patient was advised that her anxiety may have
 contributed to her extremely elevated blood pressures last
 night.  We will await her 24-hour urine results to ensure that
 she does not have preeclampsia.  Should the patient's blood
 pressures remain under good control using labetalol and
 Procardia, she may be discharged home tomorrow with twice
 weekly fetal testing and blood pressure checks in your office.
 Her liver function tests should be repeated tomorrow morning
 to ensure that they have not increased.  She should also
 have weekly PIH labs drawn as an outpatient.
 Due to fetal growth restriction, she should also have weekly
 umbilical artery Doppler studies performed.  The patient was
 advised that due to chronic hypertension and fetal growth
 restriction, our goal for her pregnancy would be delivery at
 around 37 weeks.  An earlier delivery may be necessary
 should she develop signs and symptoms of severe
 preeclampsia or if her PIH labs are abnormal.
 At the end of the consultation, the patient and her husband
 stated that all their questions had been answered to their
 complete satisfaction.
 Thank you for referring this patient for Maternal-Fetal
 Medicine consultation.
 Recommendations:
 Repeat PIH labs tomorrow morning
 Outpatient management may be considered should her blood
 pressures and PIH labs remain normal
 Continue labetalol and Procardia for treatment of her
 elevated blood pressures
 Twice-weekly fetal testing
 Weekly PIH labs
 Weekly fluid checks with umbilical artery Doppler studies
 should be performed due to fetal growth restriction
 Delivery may be considered at around 37 weeks
 An earlier delivery may be necessary should she show any
 signs or symptoms of severe preeclampsia or if her PIH labs
 become abnormal

## 2022-01-18 ENCOUNTER — Telehealth: Payer: Self-pay

## 2022-01-18 DIAGNOSIS — Z0000 Encounter for general adult medical examination without abnormal findings: Secondary | ICD-10-CM

## 2022-01-18 NOTE — Telephone Encounter (Signed)
Called patient to determine if patient still had access to Vivify cuff and needed to return it to Dr. Duke Salvia. Patient stated that she changed cardiologists and asked in the process if she needed to return anything and was told no. Patient has no idea where the cuff is at this time. Provided an update to Dr. Duke Salvia.  Akil Hoos Nedra Hai, [facility] Guide, Health Coach [address], [address] [place] [address] Telephone: [phone] Email: [email or web address]

## 2022-08-06 ENCOUNTER — Ambulatory Visit: Payer: BC Managed Care – PPO | Admitting: Podiatry
